# Patient Record
Sex: Male | Born: 1990 | Race: White | Hispanic: No | Marital: Single | Smoking: Never smoker
Health system: Southern US, Community
[De-identification: ages and names within clinical notes are randomized; demographics above are authoritative.]

## PROBLEM LIST (undated history)

## (undated) DIAGNOSIS — I10 Essential (primary) hypertension: Secondary | ICD-10-CM

## (undated) DIAGNOSIS — E119 Type 2 diabetes mellitus without complications: Secondary | ICD-10-CM

---

## 2015-11-03 ENCOUNTER — Inpatient Hospital Stay
Admit: 2015-11-03 | Discharge: 2015-11-03 | Disposition: A | Discharge status: Home / Self Care | Payer: Self-pay | Attending: Adult Health | Admitting: Adult Health

## 2015-11-03 ENCOUNTER — Inpatient Hospital Stay: Payer: Self-pay

## 2015-11-03 ENCOUNTER — Inpatient Hospital Stay
Admission: EM | Admit: 2015-11-03 | Discharge: 2015-11-06 | DRG: 377 | Disposition: E | Payer: Self-pay | Attending: Internal Medicine | Admitting: Internal Medicine

## 2015-11-03 ENCOUNTER — Encounter: Payer: Self-pay | Admitting: Emergency Medicine

## 2015-11-03 ENCOUNTER — Emergency Department: Payer: Self-pay

## 2015-11-03 DIAGNOSIS — G931 Anoxic brain damage, not elsewhere classified: Secondary | ICD-10-CM | POA: Diagnosis present

## 2015-11-03 DIAGNOSIS — R579 Shock, unspecified: Secondary | ICD-10-CM | POA: Diagnosis present

## 2015-11-03 DIAGNOSIS — D62 Acute posthemorrhagic anemia: Secondary | ICD-10-CM

## 2015-11-03 DIAGNOSIS — E875 Hyperkalemia: Secondary | ICD-10-CM | POA: Diagnosis present

## 2015-11-03 DIAGNOSIS — R109 Unspecified abdominal pain: Secondary | ICD-10-CM

## 2015-11-03 DIAGNOSIS — J189 Pneumonia, unspecified organism: Secondary | ICD-10-CM | POA: Diagnosis present

## 2015-11-03 DIAGNOSIS — E1022 Type 1 diabetes mellitus with diabetic chronic kidney disease: Secondary | ICD-10-CM | POA: Diagnosis present

## 2015-11-03 DIAGNOSIS — J9811 Atelectasis: Secondary | ICD-10-CM

## 2015-11-03 DIAGNOSIS — E872 Acidosis: Secondary | ICD-10-CM | POA: Diagnosis present

## 2015-11-03 DIAGNOSIS — I161 Hypertensive emergency: Secondary | ICD-10-CM | POA: Diagnosis present

## 2015-11-03 DIAGNOSIS — I469 Cardiac arrest, cause unspecified: Secondary | ICD-10-CM | POA: Diagnosis present

## 2015-11-03 DIAGNOSIS — G9382 Brain death: Secondary | ICD-10-CM | POA: Diagnosis present

## 2015-11-03 DIAGNOSIS — R140 Abdominal distension (gaseous): Secondary | ICD-10-CM

## 2015-11-03 DIAGNOSIS — J9690 Respiratory failure, unspecified, unspecified whether with hypoxia or hypercapnia: Secondary | ICD-10-CM

## 2015-11-03 DIAGNOSIS — N189 Chronic kidney disease, unspecified: Secondary | ICD-10-CM | POA: Diagnosis present

## 2015-11-03 DIAGNOSIS — Z794 Long term (current) use of insulin: Secondary | ICD-10-CM

## 2015-11-03 DIAGNOSIS — Z66 Do not resuscitate: Secondary | ICD-10-CM | POA: Diagnosis not present

## 2015-11-03 DIAGNOSIS — J9600 Acute respiratory failure, unspecified whether with hypoxia or hypercapnia: Secondary | ICD-10-CM

## 2015-11-03 DIAGNOSIS — R4189 Other symptoms and signs involving cognitive functions and awareness: Secondary | ICD-10-CM

## 2015-11-03 DIAGNOSIS — R0602 Shortness of breath: Secondary | ICD-10-CM

## 2015-11-03 DIAGNOSIS — A419 Sepsis, unspecified organism: Secondary | ICD-10-CM | POA: Diagnosis present

## 2015-11-03 DIAGNOSIS — E1021 Type 1 diabetes mellitus with diabetic nephropathy: Secondary | ICD-10-CM | POA: Diagnosis present

## 2015-11-03 DIAGNOSIS — K256 Chronic or unspecified gastric ulcer with both hemorrhage and perforation: Principal | ICD-10-CM | POA: Diagnosis present

## 2015-11-03 DIAGNOSIS — K922 Gastrointestinal hemorrhage, unspecified: Secondary | ICD-10-CM

## 2015-11-03 DIAGNOSIS — N170 Acute kidney failure with tubular necrosis: Secondary | ICD-10-CM | POA: Diagnosis present

## 2015-11-03 DIAGNOSIS — I468 Cardiac arrest due to other underlying condition: Secondary | ICD-10-CM | POA: Diagnosis present

## 2015-11-03 DIAGNOSIS — R402432 Glasgow coma scale score 3-8, at arrival to emergency department: Secondary | ICD-10-CM | POA: Diagnosis present

## 2015-11-03 DIAGNOSIS — Z452 Encounter for adjustment and management of vascular access device: Secondary | ICD-10-CM

## 2015-11-03 DIAGNOSIS — E861 Hypovolemia: Secondary | ICD-10-CM | POA: Diagnosis present

## 2015-11-03 DIAGNOSIS — J9601 Acute respiratory failure with hypoxia: Secondary | ICD-10-CM | POA: Diagnosis present

## 2015-11-03 HISTORY — DX: Type 2 diabetes mellitus without complications: E11.9

## 2015-11-03 HISTORY — DX: Essential (primary) hypertension: I10

## 2015-11-03 LAB — BLOOD GAS, ARTERIAL
ACID-BASE DEFICIT: 7.2 mmol/L — AB (ref 0.0–2.0)
Acid-base deficit: 21.4 mmol/L — ABNORMAL HIGH (ref 0.0–2.0)
BICARBONATE: 8.1 meq/L — AB (ref 21.0–28.0)
BICARBONATE: 17.5 meq/L — AB (ref 21.0–28.0)
FIO2: 1
FIO2: 0.5
MECHANICAL RATE: 18
MECHVT: 500 mL
Mechanical Rate: 18
O2 Saturation: 99.1 %
O2 Saturation: 99.3 %
PATIENT TEMPERATURE: 37
PATIENT TEMPERATURE: 37
PCO2 ART: 33 mmHg (ref 32.0–48.0)
PEEP/CPAP: 5 cmH2O
PEEP: 5 cmH2O
PH ART: 7.36 (ref 7.350–7.450)
PO2 ART: 189 mmHg — AB (ref 83.0–108.0)
VT: 500 mL
pCO2 arterial: 31 mmHg — ABNORMAL LOW (ref 32.0–48.0)
pH, Arterial: 7 — CL (ref 7.350–7.450)
pO2, Arterial: 153 mmHg — ABNORMAL HIGH (ref 83.0–108.0)

## 2015-11-03 LAB — BASIC METABOLIC PANEL
Anion gap: 30 — ABNORMAL HIGH (ref 5–15)
Anion gap: 22 — ABNORMAL HIGH (ref 5–15)
Anion gap: 25 — ABNORMAL HIGH (ref 5–15)
Anion gap: 21 — ABNORMAL HIGH (ref 5–15)
BUN: 176 mg/dL — AB (ref 6–20)
BUN: 173 mg/dL — AB (ref 6–20)
BUN: 161 mg/dL — AB (ref 6–20)
BUN: 157 mg/dL — AB (ref 6–20)
CHLORIDE: 95 mmol/L — AB (ref 101–111)
CHLORIDE: 99 mmol/L — AB (ref 101–111)
CHLORIDE: 97 mmol/L — AB (ref 101–111)
CHLORIDE: 98 mmol/L — AB (ref 101–111)
CO2: 13 mmol/L — AB (ref 22–32)
CO2: 15 mmol/L — AB (ref 22–32)
CO2: 15 mmol/L — AB (ref 22–32)
CO2: 16 mmol/L — AB (ref 22–32)
CREATININE: 12.35 mg/dL — AB (ref 0.61–1.24)
CREATININE: 12.62 mg/dL — AB (ref 0.61–1.24)
CREATININE: 11.5 mg/dL — AB (ref 0.61–1.24)
CREATININE: 11.18 mg/dL — AB (ref 0.61–1.24)
Calcium: 8 mg/dL — ABNORMAL LOW (ref 8.9–10.3)
Calcium: 7.4 mg/dL — ABNORMAL LOW (ref 8.9–10.3)
Calcium: 7.5 mg/dL — ABNORMAL LOW (ref 8.9–10.3)
Calcium: 7.4 mg/dL — ABNORMAL LOW (ref 8.9–10.3)
GFR calc Af Amer: 6 mL/min — ABNORMAL LOW (ref >60)
GFR calc Af Amer: 6 mL/min — ABNORMAL LOW (ref >60)
GFR calc Af Amer: 6 mL/min — ABNORMAL LOW (ref >60)
GFR calc Af Amer: 6 mL/min — ABNORMAL LOW (ref >60)
GFR calc non Af Amer: 5 mL/min — ABNORMAL LOW (ref >60)
GFR calc non Af Amer: 5 mL/min — ABNORMAL LOW (ref >60)
GFR calc non Af Amer: 5 mL/min — ABNORMAL LOW (ref >60)
GFR calc non Af Amer: 6 mL/min — ABNORMAL LOW (ref >60)
GLUCOSE: 336 mg/dL — AB (ref 65–99)
GLUCOSE: 358 mg/dL — AB (ref 65–99)
GLUCOSE: 367 mg/dL — AB (ref 65–99)
Glucose, Bld: 345 mg/dL — ABNORMAL HIGH (ref 65–99)
POTASSIUM: 5.8 mmol/L — AB (ref 3.5–5.1)
Potassium: 6.9 mmol/L (ref 3.5–5.1)
Potassium: 6.6 mmol/L (ref 3.5–5.1)
Potassium: 6.3 mmol/L (ref 3.5–5.1)
SODIUM: 138 mmol/L (ref 135–145)
SODIUM: 136 mmol/L (ref 135–145)
Sodium: 137 mmol/L (ref 135–145)
Sodium: 135 mmol/L (ref 135–145)

## 2015-11-03 LAB — TROPONIN I
TROPONIN I: 0.47 ng/mL — AB (ref <0.03)
TROPONIN I: 0.51 ng/mL — AB (ref <0.03)
Troponin I: 0.17 ng/mL (ref <0.03)
Troponin I: 0.56 ng/mL (ref <0.03)
Troponin I: 0.5 ng/mL (ref <0.03)

## 2015-11-03 LAB — RENAL FUNCTION PANEL
ANION GAP: 28 — AB (ref 5–15)
Albumin: 2.5 g/dL — ABNORMAL LOW (ref 3.5–5.0)
Albumin: 2.3 g/dL — ABNORMAL LOW (ref 3.5–5.0)
Anion gap: 23 — ABNORMAL HIGH (ref 5–15)
BUN: 171 mg/dL — AB (ref 6–20)
BUN: 171 mg/dL — ABNORMAL HIGH (ref 6–20)
CALCIUM: 7.8 mg/dL — AB (ref 8.9–10.3)
CHLORIDE: 99 mmol/L — AB (ref 101–111)
CHLORIDE: 96 mmol/L — AB (ref 101–111)
CO2: 14 mmol/L — AB (ref 22–32)
CO2: 13 mmol/L — AB (ref 22–32)
Calcium: 7.7 mg/dL — ABNORMAL LOW (ref 8.9–10.3)
Creatinine, Ser: 12.73 mg/dL — ABNORMAL HIGH (ref 0.61–1.24)
Creatinine, Ser: 12.29 mg/dL — ABNORMAL HIGH (ref 0.61–1.24)
GFR calc Af Amer: 5 mL/min — ABNORMAL LOW (ref >60)
GFR calc non Af Amer: 5 mL/min — ABNORMAL LOW (ref >60)
GFR calc non Af Amer: 5 mL/min — ABNORMAL LOW (ref >60)
GFR, EST AFRICAN AMERICAN: 6 mL/min — AB (ref >60)
GLUCOSE: 311 mg/dL — AB (ref 65–99)
Glucose, Bld: 345 mg/dL — ABNORMAL HIGH (ref 65–99)
POTASSIUM: 6.8 mmol/L — AB (ref 3.5–5.1)
POTASSIUM: 6.8 mmol/L — AB (ref 3.5–5.1)
Phosphorus: 12 mg/dL — ABNORMAL HIGH (ref 2.5–4.6)
Phosphorus: 11.1 mg/dL — ABNORMAL HIGH (ref 2.5–4.6)
SODIUM: 137 mmol/L (ref 135–145)
Sodium: 136 mmol/L (ref 135–145)

## 2015-11-03 LAB — CBC
HCT: 21.7 % — ABNORMAL LOW (ref 40.0–52.0)
HEMATOCRIT: 10.2 % — AB (ref 40.0–52.0)
HEMATOCRIT: 17.3 % — AB (ref 40.0–52.0)
HEMATOCRIT: 17.2 % — AB (ref 40.0–52.0)
HEMOGLOBIN: 3 g/dL — AB (ref 13.0–18.0)
HEMOGLOBIN: 7.3 g/dL — AB (ref 13.0–18.0)
HEMOGLOBIN: 5.9 g/dL — AB (ref 13.0–18.0)
Hemoglobin: 6 g/dL — ABNORMAL LOW (ref 13.0–18.0)
MCH: 29.6 pg (ref 26.0–34.0)
MCH: 29.5 pg (ref 26.0–34.0)
MCH: 29.4 pg (ref 26.0–34.0)
MCH: 28.9 pg (ref 26.0–34.0)
MCHC: 29.6 g/dL — ABNORMAL LOW (ref 32.0–36.0)
MCHC: 33.4 g/dL (ref 32.0–36.0)
MCHC: 34.7 g/dL (ref 32.0–36.0)
MCHC: 34.3 g/dL (ref 32.0–36.0)
MCV: 100.2 fL — AB (ref 80.0–100.0)
MCV: 88.2 fL (ref 80.0–100.0)
MCV: 84.6 fL (ref 80.0–100.0)
MCV: 84.4 fL (ref 80.0–100.0)
PLATELETS: 173 10*3/uL (ref 150–440)
PLATELETS: 145 10*3/uL — AB (ref 150–440)
Platelets: 193 10*3/uL (ref 150–440)
Platelets: 104 10*3/uL — ABNORMAL LOW (ref 150–440)
RBC: 1.01 MIL/uL — AB (ref 4.40–5.90)
RBC: 2.46 MIL/uL — ABNORMAL LOW (ref 4.40–5.90)
RBC: 2.05 MIL/uL — ABNORMAL LOW (ref 4.40–5.90)
RBC: 2.04 MIL/uL — ABNORMAL LOW (ref 4.40–5.90)
RDW: 16.5 % — ABNORMAL HIGH (ref 11.5–14.5)
RDW: 16.2 % — AB (ref 11.5–14.5)
RDW: 16.5 % — AB (ref 11.5–14.5)
RDW: 16.9 % — AB (ref 11.5–14.5)
WBC: 13.3 10*3/uL — ABNORMAL HIGH (ref 3.8–10.6)
WBC: 18.3 10*3/uL — ABNORMAL HIGH (ref 3.8–10.6)
WBC: 12.5 10*3/uL — ABNORMAL HIGH (ref 3.8–10.6)
WBC: 12.5 10*3/uL — ABNORMAL HIGH (ref 3.8–10.6)

## 2015-11-03 LAB — ABO/RH: ABO/RH(D): O POS

## 2015-11-03 LAB — DIFFERENTIAL
Basophils Absolute: 0.1 10*3/uL (ref 0–0.1)
Basophils Relative: 1 %
Eosinophils Absolute: 0.1 10*3/uL (ref 0–0.7)
Eosinophils Relative: 0 %
LYMPHS ABS: 2.7 10*3/uL (ref 1.0–3.6)
MONO ABS: 0.3 10*3/uL (ref 0.2–1.0)
NEUTROS ABS: 10.1 10*3/uL — AB (ref 1.4–6.5)
Neutrophils Relative %: 75 %

## 2015-11-03 LAB — COMPREHENSIVE METABOLIC PANEL
ALBUMIN: 2.4 g/dL — AB (ref 3.5–5.0)
ALK PHOS: 92 U/L (ref 38–126)
ALT: 26 U/L (ref 17–63)
ALT: 26 U/L (ref 17–63)
ANION GAP: 31 — AB (ref 5–15)
AST: 35 U/L (ref 15–41)
AST: 51 U/L — ABNORMAL HIGH (ref 15–41)
Albumin: 2.4 g/dL — ABNORMAL LOW (ref 3.5–5.0)
Alkaline Phosphatase: 85 U/L (ref 38–126)
Anion gap: 25 — ABNORMAL HIGH (ref 5–15)
BILIRUBIN TOTAL: 0.7 mg/dL (ref 0.3–1.2)
BILIRUBIN TOTAL: 1.1 mg/dL (ref 0.3–1.2)
BUN: 188 mg/dL — ABNORMAL HIGH (ref 6–20)
BUN: 171 mg/dL — ABNORMAL HIGH (ref 6–20)
CALCIUM: 8.4 mg/dL — AB (ref 8.9–10.3)
CO2: 10 mmol/L — AB (ref 22–32)
CO2: 13 mmol/L — ABNORMAL LOW (ref 22–32)
CREATININE: 12.93 mg/dL — AB (ref 0.61–1.24)
Calcium: 8.1 mg/dL — ABNORMAL LOW (ref 8.9–10.3)
Chloride: 99 mmol/L — ABNORMAL LOW (ref 101–111)
Chloride: 95 mmol/L — ABNORMAL LOW (ref 101–111)
Creatinine, Ser: 12.43 mg/dL — ABNORMAL HIGH (ref 0.61–1.24)
GFR calc Af Amer: 6 mL/min — ABNORMAL LOW (ref >60)
GFR calc non Af Amer: 5 mL/min — ABNORMAL LOW (ref >60)
GFR, EST AFRICAN AMERICAN: 5 mL/min — AB (ref >60)
GFR, EST NON AFRICAN AMERICAN: 5 mL/min — AB (ref >60)
GLUCOSE: 240 mg/dL — AB (ref 65–99)
Glucose, Bld: 333 mg/dL — ABNORMAL HIGH (ref 65–99)
POTASSIUM: 6.7 mmol/L — AB (ref 3.5–5.1)
Potassium: 6.5 mmol/L (ref 3.5–5.1)
SODIUM: 134 mmol/L — AB (ref 135–145)
Sodium: 139 mmol/L (ref 135–145)
TOTAL PROTEIN: 5 g/dL — AB (ref 6.5–8.1)
Total Protein: 5.1 g/dL — ABNORMAL LOW (ref 6.5–8.1)

## 2015-11-03 LAB — GLUCOSE, CAPILLARY
GLUCOSE-CAPILLARY: 279 mg/dL — AB (ref 65–99)
GLUCOSE-CAPILLARY: 331 mg/dL — AB (ref 65–99)
GLUCOSE-CAPILLARY: 387 mg/dL — AB (ref 65–99)
Glucose-Capillary: 272 mg/dL — ABNORMAL HIGH (ref 65–99)
Glucose-Capillary: 360 mg/dL — ABNORMAL HIGH (ref 65–99)

## 2015-11-03 LAB — ALBUMIN: Albumin: 2.2 g/dL — ABNORMAL LOW (ref 3.5–5.0)

## 2015-11-03 LAB — MAGNESIUM
MAGNESIUM: 3.6 mg/dL — AB (ref 1.7–2.4)
MAGNESIUM: 3.3 mg/dL — AB (ref 1.7–2.4)
MAGNESIUM: 3.2 mg/dL — AB (ref 1.7–2.4)
Magnesium: 3.5 mg/dL — ABNORMAL HIGH (ref 1.7–2.4)

## 2015-11-03 LAB — LIPID PANEL
Cholesterol: 56 mg/dL (ref 0–200)
HDL: 18 mg/dL — ABNORMAL LOW
LDL Cholesterol: 17 mg/dL (ref 0–99)
Total CHOL/HDL Ratio: 3.1 ratio
Triglycerides: 103 mg/dL
VLDL: 21 mg/dL (ref 0–40)

## 2015-11-03 LAB — TRIGLYCERIDES: Triglycerides: 145 mg/dL (ref <150)

## 2015-11-03 LAB — PHOSPHORUS
PHOSPHORUS: 11.8 mg/dL — AB (ref 2.5–4.6)
PHOSPHORUS: 10.3 mg/dL — AB (ref 2.5–4.6)

## 2015-11-03 LAB — PROTIME-INR
INR: 1.46
INR: 1.54
INR: 1.55
Prothrombin Time: 17.9 seconds — ABNORMAL HIGH (ref 11.4–15.2)
Prothrombin Time: 18.6 seconds — ABNORMAL HIGH (ref 11.4–15.2)
Prothrombin Time: 18.7 seconds — ABNORMAL HIGH (ref 11.4–15.2)

## 2015-11-03 LAB — LIPASE, BLOOD: LIPASE: 53 U/L — AB (ref 11–51)

## 2015-11-03 LAB — MRSA PCR SCREENING: MRSA by PCR: NEGATIVE

## 2015-11-03 LAB — PROCALCITONIN: Procalcitonin: 1.37 ng/mL

## 2015-11-03 LAB — APTT
APTT: 33 s (ref 24–36)
APTT: 36 s (ref 24–36)
APTT: 39 s — AB (ref 24–36)

## 2015-11-03 LAB — CK: CK TOTAL: 874 U/L — AB (ref 49–397)

## 2015-11-03 LAB — PREPARE RBC (CROSSMATCH)

## 2015-11-03 MED ORDER — SODIUM CHLORIDE 0.9 % IV SOLN: 2000.0000 mL | Freq: Once | INTRAVENOUS | Status: DC

## 2015-11-03 MED ORDER — SODIUM CHLORIDE 0.9 % IV SOLN: 1.0000 g | Freq: Once | INTRAVENOUS | Status: DC

## 2015-11-03 MED ORDER — FENTANYL 2500MCG IN NS 250ML (10MCG/ML) PREMIX INFUSION
100.0000 ug/h | INTRAVENOUS | Status: DC
  Administered 2015-11-03: 100 ug/h via INTRAVENOUS
  Administered 2015-11-04: 75 ug/h via INTRAVENOUS
  Filled 2015-11-03 (×2): qty 250

## 2015-11-03 MED ORDER — ACETAMINOPHEN 650 MG RE SUPP: 650.0000 mg | Freq: Four times a day (QID) | RECTAL | Status: DC | PRN

## 2015-11-03 MED ORDER — VANCOMYCIN HCL IN DEXTROSE 1-5 GM/200ML-% IV SOLN
1000.0000 mg | INTRAVENOUS | Status: DC | PRN
  Filled 2015-11-03: qty 200

## 2015-11-03 MED ORDER — CALCIUM GLUCONATE 10 % IV SOLN
INTRAVENOUS | Status: AC
  Filled 2015-11-03: qty 10

## 2015-11-03 MED ORDER — OCTREOTIDE ACETATE 500 MCG/ML IJ SOLN
50.0000 ug/h | INTRAMUSCULAR | Status: DC
  Administered 2015-11-03 – 2015-11-05 (×3): 50 ug/h via INTRAVENOUS
  Filled 2015-11-03 (×11): qty 1

## 2015-11-03 MED ORDER — SODIUM CHLORIDE 0.9 % IV SOLN
Freq: Once | INTRAVENOUS | Status: AC
  Administered 2015-11-03: 05:00:00 via INTRAVENOUS

## 2015-11-03 MED ORDER — FENTANYL BOLUS VIA INFUSION
50.0000 ug | INTRAVENOUS | Status: DC | PRN
  Filled 2015-11-03: qty 50

## 2015-11-03 MED ORDER — INSULIN ASPART 100 UNIT/ML ~~LOC~~ SOLN
0.0000 [IU] | SUBCUTANEOUS | Status: DC
  Administered 2015-11-03: 7 [IU] via SUBCUTANEOUS
  Administered 2015-11-03: 9 [IU] via SUBCUTANEOUS
  Administered 2015-11-04: 5 [IU] via SUBCUTANEOUS
  Administered 2015-11-04: 9 [IU] via SUBCUTANEOUS
  Administered 2015-11-04: 7 [IU] via SUBCUTANEOUS
  Filled 2015-11-03: qty 9
  Filled 2015-11-03: qty 7
  Filled 2015-11-03: qty 5
  Filled 2015-11-03: qty 7
  Filled 2015-11-03: qty 9

## 2015-11-03 MED ORDER — PROPOFOL 1000 MG/100ML IV EMUL
INTRAVENOUS | Status: AC
  Administered 2015-11-03: 5 ug/kg/min via INTRAVENOUS
  Filled 2015-11-03: qty 100

## 2015-11-03 MED ORDER — HYDRALAZINE HCL 20 MG/ML IJ SOLN
INTRAMUSCULAR | Status: AC
  Filled 2015-11-03: qty 1

## 2015-11-03 MED ORDER — DOPAMINE-DEXTROSE 3.2-5 MG/ML-% IV SOLN: 10.0000 ug/kg/min | Freq: Once | INTRAVENOUS | Status: DC

## 2015-11-03 MED ORDER — VANCOMYCIN HCL IN DEXTROSE 750-5 MG/150ML-% IV SOLN
750.0000 mg | INTRAVENOUS | Status: DC | PRN
  Filled 2015-11-03: qty 150

## 2015-11-03 MED ORDER — FENTANYL CITRATE (PF) 100 MCG/2ML IJ SOLN
100.0000 ug | Freq: Once | INTRAMUSCULAR | Status: AC
  Administered 2015-11-03: 100 ug via INTRAVENOUS

## 2015-11-03 MED ORDER — PIPERACILLIN-TAZOBACTAM 3.375 G IVPB
3.3750 g | Freq: Two times a day (BID) | INTRAVENOUS | Status: DC
  Administered 2015-11-03: 3.375 g via INTRAVENOUS
  Filled 2015-11-03 (×2): qty 50

## 2015-11-03 MED ORDER — PIPERACILLIN-TAZOBACTAM 3.375 G IVPB
3.3750 g | Freq: Three times a day (TID) | INTRAVENOUS | Status: DC
  Administered 2015-11-03 – 2015-11-05 (×5): 3.375 g via INTRAVENOUS
  Filled 2015-11-03 (×7): qty 50

## 2015-11-03 MED ORDER — CALCIUM GLUCONATE 10 % IV SOLN
1.0000 g | Freq: Once | INTRAVENOUS | Status: AC
  Administered 2015-11-03: 1 g via INTRAVENOUS

## 2015-11-03 MED ORDER — ATROPINE SULFATE 1 MG/ML IJ SOLN
INTRAMUSCULAR | Status: AC | PRN
  Administered 2015-11-03 (×2): 1 mg via INTRAVENOUS

## 2015-11-03 MED ORDER — PANTOPRAZOLE SODIUM 40 MG IV SOLR: 40.0000 mg | Freq: Two times a day (BID) | INTRAVENOUS | Status: DC

## 2015-11-03 MED ORDER — DEXTROSE 5 % IV SOLN
INTRAVENOUS | Status: DC
  Administered 2015-11-03 – 2015-11-05 (×5): via INTRAVENOUS
  Filled 2015-11-03 (×12): qty 150

## 2015-11-03 MED ORDER — SODIUM BICARBONATE 8.4 % IV SOLN
INTRAVENOUS | Status: AC
  Filled 2015-11-03: qty 100

## 2015-11-03 MED ORDER — SODIUM CHLORIDE 0.9 % IV SOLN
Freq: Once | INTRAVENOUS | Status: AC
  Administered 2015-11-03: 07:00:00 via INTRAVENOUS

## 2015-11-03 MED ORDER — INSULIN ASPART 100 UNIT/ML ~~LOC~~ SOLN
SUBCUTANEOUS | Status: AC
  Filled 2015-11-03: qty 10

## 2015-11-03 MED ORDER — CISATRACURIUM BOLUS VIA INFUSION
0.1000 mg/kg | Freq: Once | INTRAVENOUS | Status: DC
  Filled 2015-11-03: qty 7

## 2015-11-03 MED ORDER — SODIUM CHLORIDE 0.9 % IV SOLN
INTRAVENOUS | Status: DC
  Administered 2015-11-03: 07:00:00 via INTRAVENOUS

## 2015-11-03 MED ORDER — ROCURONIUM BROMIDE 50 MG/5ML IV SOLN
1.0000 mg/kg | Freq: Once | INTRAVENOUS | Status: AC
  Administered 2015-11-03: 70 mg via INTRAVENOUS

## 2015-11-03 MED ORDER — ASPIRIN 300 MG RE SUPP
300.0000 mg | RECTAL | Status: AC
  Filled 2015-11-03: qty 1

## 2015-11-03 MED ORDER — VANCOMYCIN HCL IN DEXTROSE 1-5 GM/200ML-% IV SOLN
1000.0000 mg | Freq: Once | INTRAVENOUS | Status: AC
  Administered 2015-11-03: 1000 mg via INTRAVENOUS
  Filled 2015-11-03: qty 200

## 2015-11-03 MED ORDER — HEPARIN SODIUM (PORCINE) 1000 UNIT/ML DIALYSIS
1000.0000 [IU] | INTRAMUSCULAR | Status: DC | PRN
  Administered 2015-11-05: 1000 [IU] via INTRAVENOUS_CENTRAL
  Filled 2015-11-03 (×2): qty 10

## 2015-11-03 MED ORDER — ARTIFICIAL TEARS OP OINT
1.0000 "application " | TOPICAL_OINTMENT | Freq: Three times a day (TID) | OPHTHALMIC | Status: DC
  Administered 2015-11-03 – 2015-11-05 (×6): 1 via OPHTHALMIC
  Filled 2015-11-03: qty 3.5

## 2015-11-03 MED ORDER — MIDAZOLAM HCL 2 MG/2ML IJ SOLN: 2.0000 mg | INTRAMUSCULAR | Status: DC | PRN

## 2015-11-03 MED ORDER — SODIUM CHLORIDE 0.9 % IV SOLN
1.0000 ug/kg/min | INTRAVENOUS | Status: DC
  Filled 2015-11-03: qty 20

## 2015-11-03 MED ORDER — SODIUM BICARBONATE 8.4 % IV SOLN
INTRAVENOUS | Status: AC | PRN
  Administered 2015-11-03: 50 meq via INTRAVENOUS

## 2015-11-03 MED ORDER — CHLORHEXIDINE GLUCONATE 0.12% ORAL RINSE (MEDLINE KIT)
15.0000 mL | Freq: Two times a day (BID) | OROMUCOSAL | Status: DC
  Administered 2015-11-03 – 2015-11-05 (×4): 15 mL via OROMUCOSAL
  Filled 2015-11-03 (×5): qty 15

## 2015-11-03 MED ORDER — SODIUM BICARBONATE 8.4 % IV SOLN
50.0000 meq | Freq: Once | INTRAVENOUS | Status: AC
  Administered 2015-11-03: 50 meq via INTRAVENOUS

## 2015-11-03 MED ORDER — PROPOFOL 1000 MG/100ML IV EMUL
5.0000 ug/kg/min | INTRAVENOUS | Status: DC
  Administered 2015-11-03 (×2): 5 ug/kg/min via INTRAVENOUS

## 2015-11-03 MED ORDER — SODIUM CHLORIDE 0.9 % IV SOLN: 250.0000 mL | INTRAVENOUS | Status: DC | PRN

## 2015-11-03 MED ORDER — SODIUM CHLORIDE 0.9 % IV SOLN
80.0000 mg | Freq: Once | INTRAVENOUS | Status: AC
  Administered 2015-11-03: 80 mg via INTRAVENOUS
  Filled 2015-11-03: qty 80

## 2015-11-03 MED ORDER — ROCURONIUM BROMIDE 50 MG/5ML IV SOLN
INTRAVENOUS | Status: AC
  Filled 2015-11-03: qty 1

## 2015-11-03 MED ORDER — OCTREOTIDE LOAD VIA INFUSION
50.0000 ug | Freq: Once | INTRAVENOUS | Status: AC
  Administered 2015-11-03: 50 ug via INTRAVENOUS
  Filled 2015-11-03: qty 25

## 2015-11-03 MED ORDER — ONDANSETRON HCL 4 MG/2ML IJ SOLN: 4.0000 mg | Freq: Four times a day (QID) | INTRAMUSCULAR | Status: DC | PRN

## 2015-11-03 MED ORDER — NOREPINEPHRINE 4 MG/250ML-% IV SOLN: 0.0000 ug/min | INTRAVENOUS | Status: DC

## 2015-11-03 MED ORDER — HYDRALAZINE HCL 20 MG/ML IJ SOLN
10.0000 mg | INTRAMUSCULAR | Status: DC | PRN
  Administered 2015-11-03: 10 mg via INTRAVENOUS
  Administered 2015-11-04 (×2): 20 mg via INTRAVENOUS
  Filled 2015-11-03 (×3): qty 1

## 2015-11-03 MED ORDER — INSULIN ASPART 100 UNIT/ML ~~LOC~~ SOLN: 2.0000 [IU] | SUBCUTANEOUS | Status: DC

## 2015-11-03 MED ORDER — PUREFLOW DIALYSIS SOLUTION
INTRAVENOUS | Status: DC
  Administered 2015-11-03 – 2015-11-05 (×6): via INTRAVENOUS_CENTRAL

## 2015-11-03 MED ORDER — FAMOTIDINE IN NACL 20-0.9 MG/50ML-% IV SOLN
20.0000 mg | Freq: Two times a day (BID) | INTRAVENOUS | Status: DC
  Administered 2015-11-03: 20 mg via INTRAVENOUS
  Filled 2015-11-03 (×2): qty 50

## 2015-11-03 MED ORDER — PANTOPRAZOLE SODIUM 40 MG IV SOLR
8.0000 mg/h | INTRAVENOUS | Status: DC
  Administered 2015-11-03 – 2015-11-05 (×5): 8 mg/h via INTRAVENOUS
  Filled 2015-11-03 (×5): qty 80

## 2015-11-03 MED ORDER — PROPOFOL 1000 MG/100ML IV EMUL
25.0000 ug/kg/min | INTRAVENOUS | Status: DC
  Administered 2015-11-03: 30 ug/kg/min via INTRAVENOUS
  Administered 2015-11-04 (×2): 40 ug/kg/min via INTRAVENOUS
  Administered 2015-11-04 – 2015-11-05 (×2): 30 ug/kg/min via INTRAVENOUS
  Filled 2015-11-03: qty 100

## 2015-11-03 MED ORDER — SODIUM BICARBONATE 8.4 % IV SOLN: 100.0000 meq | Freq: Once | INTRAVENOUS | Status: DC

## 2015-11-03 MED ORDER — NOREPINEPHRINE BITARTRATE 1 MG/ML IV SOLN: 0.0000 ug/min | INTRAVENOUS | Status: DC

## 2015-11-03 MED ORDER — PROPOFOL 1000 MG/100ML IV EMUL
0.0000 ug/kg/min | INTRAVENOUS | Status: DC
  Administered 2015-11-03: 8 ug/kg/min via INTRAVENOUS
  Filled 2015-11-03 (×8): qty 100

## 2015-11-03 MED ORDER — EPINEPHRINE HCL 0.1 MG/ML IJ SOSY
PREFILLED_SYRINGE | INTRAMUSCULAR | Status: AC | PRN
  Administered 2015-11-03 (×2): 1 via INTRAVENOUS

## 2015-11-03 MED ORDER — ANTISEPTIC ORAL RINSE SOLUTION (CORINZ)
7.0000 mL | OROMUCOSAL | Status: DC
  Administered 2015-11-03 – 2015-11-05 (×18): 7 mL via OROMUCOSAL
  Filled 2015-11-03 (×21): qty 7

## 2015-11-03 MED ORDER — DOPAMINE-DEXTROSE 3.2-5 MG/ML-% IV SOLN
INTRAVENOUS | Status: AC | PRN
  Administered 2015-11-03: 10 ug/kg/min via INTRAVENOUS

## 2015-11-03 MED ORDER — SENNOSIDES 8.8 MG/5ML PO SYRP: 5.0000 mL | ORAL_SOLUTION | Freq: Two times a day (BID) | ORAL | Status: DC | PRN

## 2015-11-03 MED ORDER — BISACODYL 10 MG RE SUPP: 10.0000 mg | Freq: Every day | RECTAL | Status: DC | PRN

## 2015-11-03 MED ORDER — SODIUM CHLORIDE 0.9 % IV SOLN
Freq: Once | INTRAVENOUS | Status: AC
  Administered 2015-11-03: 22:00:00 via INTRAVENOUS

## 2015-11-03 MED ORDER — CISATRACURIUM BOLUS VIA INFUSION
0.0500 mg/kg | INTRAVENOUS | Status: DC | PRN
  Filled 2015-11-03: qty 4

## 2015-11-03 MED ORDER — INSULIN ASPART 100 UNIT/ML ~~LOC~~ SOLN: 0.0000 [IU] | SUBCUTANEOUS | Status: DC

## 2015-11-03 MED ORDER — INSULIN ASPART 100 UNIT/ML ~~LOC~~ SOLN
10.0000 [IU] | Freq: Once | SUBCUTANEOUS | Status: AC
  Administered 2015-11-03: 10 [IU] via INTRAVENOUS

## 2015-11-03 MED ORDER — FAMOTIDINE IN NACL 20-0.9 MG/50ML-% IV SOLN
20.0000 mg | INTRAVENOUS | Status: DC
  Filled 2015-11-03: qty 50

## 2015-11-03 MED ORDER — NICARDIPINE HCL IN NACL 20-0.86 MG/200ML-% IV SOLN
3.0000 mg/h | INTRAVENOUS | Status: DC
  Administered 2015-11-03: 3 mg/h via INTRAVENOUS
  Filled 2015-11-03: qty 200

## 2015-11-03 NOTE — Progress Notes (Signed)
Pt returned from CT at this time.  Dr. Ardyth Man at bedside to reevaluate patient.

## 2015-11-03 NOTE — H&P (Signed)
PULMONARY / CRITICAL CARE MEDICINE   Name: Jesse Gilbert MRN: [id number] DOB: [birth year]    ADMISSION DATE:  11/04/2015     DISCUSSION: 25 y/o Amish male with a  H/o T1DM, and hypertension presenting with acute blood loss anemia secondary to prolonged GI bleed, asystole cardiac arrest, acute respiratory failure and acute renal failure. High risk for anoxic brain injury given prolonged downtime, initial Hb=3.  ASSESSMENT / PLAN:  PULMONARY A: Acute hypoxic respiratory failure secondary to cardiac arrest Right upper lobe infiltrate P:   -Full vent support -VAP protocol -Daily CXR -Daily ABG -Sputum culture -ABX as above -Weaning trials when appropriate  CARDIOVASCULAR A:  Asystole cardiac arrest Hypertensive emergency P:   -Hemodynamic monitoring per ICU -Cardene infusion-titrate to keep MAP 65-100 -2-D echo -Cardiology consult -EKG  STUDIES:  2-D echo 07/29  RENAL A:   Acute renal failure-Possible acute ATN secondary to volume depletion superimposed on diabetic nephropathy Hyperkalemia P:   -Nephrology consulted -HD per nephrology -Monitor and correct electrolyte imbalances -IV fluids -Foley and foley care -Monitor Is/Os -Renally dose medications  GASTROINTESTINAL A:   Acute GI bleed-exact etiology unclear. Patient's father denies any NSAID use but patient used multiple home remedies--question whether home remedies contained aspirin. Also this may be due to uremia; initial H/H 3.0/10.2 Abdominal distension P:   -Transfuse blood products as needed  -cbc q6, stat coags.  -Protonix and octreotide infusions -GI consulted-Spoke with Dr. Servando Snare who recommended PPI and octreotide -EDG and colonoscopy once stable  HEMATOLOGIC A:   Acute blood loss anemia P:  -Transfuse blood products as needed; patient already received 4 units of PRBCs  -Monitor CBC  INFECTIOUS A:   Sepsis of unknown source-GI versus respiratory Right upper lobe infiltrate suggestive  of CAP P:   -Empiric antibiotics -F/U Cultures -Trend procalcitonin  ENDOCRINE A:   T1DM P:   -Blood glucose monitoring with SSI coverage Q4H -Insulin infusion if needed  CULTURES: O7/29 Blood x 2 Urine Sputum  ANTIBIOTICS: Vancomycin 07/29> Zosyn 07/29>  NEUROLOGIC A:   Acute encephalopathy-uremic versus hypoxic versus hypovolemia P:   RASS goal: 0 to -1 -STAT CT head  -Will consider neurology consult if patient remains unresponsive -Monitor neuro vital signs -Low dose propofol for vent dyssynchrony  SIGNIFICANT EVENTS: 07/29: Cardiac arrest at home  LINES/TUBES: Left femoral A-Line 07/29 Right Vasc cath 07/29> Left subclavian 07/29  ------------------------------------------------------  REFERRING MD:  Dr. Manson Passey  CHIEF COMPLAINT: Cardiac arrest  HISTORY OF PRESENT ILLNESS:   This is a 25 yo Amish male with a PMH of type1 DM,and hypertension who presents following a cardiac arrest at home. History is obtained from patient's father, EMS and ED records as patient is currently intubated and sedated. Per patient's father, it all started in the winter when patient developed a cough that never resolved. A few weeks ago, he started passing out dark tarry stools. He gradually got sicker and weaker until this morning when he noticed that he was unresponsive hence EMS was called. He was instructed to start CPR. Upon EMS' arrival, patient was in asystole and apneic. CPR was continued and he was given 6 doses of epi with sustained ROSC.  Upon arrival in the ED, patient coded again and received 2 doses of Epi and 2 doses of atropine. He was intubated in the ED. Total downtime is estimated at >1 hr. He continues to have dark tarry stools. Patient's father states that he took multiple home remedies and vitamins when he got sick. He  is not on any antihypertensives but takes insulin for his diabetes. Denies h/o renal insufficiency.  Initial labs showed a hemoglobin of 3.0,  creatinine of 13 and pH of 6.8.   PAST MEDICAL HISTORY :  He  has a past medical history of Diabetes mellitus without complication (HCC) and Hypertension.  PAST SURGICAL HISTORY: He  has no past surgical history on file.  No Known Allergies  No current facility-administered medications on file prior to encounter.    No current outpatient prescriptions on file prior to encounter.    FAMILY HISTORY:  His has no family status information on file.    SOCIAL HISTORY: He  reports that he has never smoked. He has never used smokeless tobacco. He reports that he does not drink alcohol or use drugs.  REVIEW OF SYSTEMS:   Unable to obtain; patient is intubated and sedated  SUBJECTIVE:    VITAL SIGNS: BP (!) 144/109   Pulse (!) 104   Temp (!) 90.6 F (32.6 C) (Rectal)   Resp 18   Ht 5\' 6"  (1.676 m)   Wt 154 lb 5.2 oz (70 kg)   SpO2 100%   BMI 24.91 kg/m   HEMODYNAMICS:    VENTILATOR SETTINGS: Vent Mode: AC FiO2 (%):  [100 %] 100 % Set Rate:  [18 bmp] 18 bmp Vt Set:  [500 mL] 500 mL PEEP:  [5 cmH20] 5 cmH20  INTAKE / OUTPUT: No intake/output data recorded.  PHYSICAL EXAMINATION: General: acutely ill-looking, edematous Neuro: Unresponsive to noxious stimulus, pupils dilated, fixed and unreactive.  HEENT: [place]/AT, oral mucosa with moderate secretions, poor dentition, ETT, trachea midline, no JVD Cardiovascular: RRR, S1/S2, no MRG, anasarca Lungs: CTAB, diminished in the bases  Abdomen: Distended, hypoactive bowel sounds, tympanic to percussion Musculoskeletal:  No visible deformities, no joint swelling Extremities: +2 edema, +2 pulses bilaterally.  Skin: pale, no rash  LABS:  BMET  Recent Labs Lab 11/08/15 0350  NA 134*  K 6.5*  CL 99*  CO2 10*  BUN 188*  CREATININE 12.93*  GLUCOSE 333*    Electrolytes  Recent Labs Lab 11/08/15 0350  CALCIUM 8.4*    CBC  Recent Labs Lab 11/08/15 0350  WBC 13.3*  HGB 3.0*  HCT 10.2*  PLT 193     Coag's No results for input(s): APTT, INR in the last 168 hours.  Sepsis Markers No results for input(s): LATICACIDVEN, PROCALCITON, O2SATVEN in the last 168 hours.  ABG  Recent Labs Lab 08-Nov-2015 0410 08-Nov-2015 0515  PHART 6.86* 7.00*  PCO2ART 49* 33  PO2ART 153* 189*    Liver Enzymes  Recent Labs Lab November 08, 2015 0350  AST 35  ALT 26  ALKPHOS 92  BILITOT 0.7  ALBUMIN 2.4*    Cardiac Enzymes  Recent Labs Lab 11-08-2015 0350  TROPONINI 0.17*    Glucose  Recent Labs Lab 11-08-15 0337  GLUCAP 272*    Imaging Dg Chest Port 1 View  Result Date: 2015/11/08 CLINICAL DATA:  25 year old male with cardiac arrest. EXAM: PORTABLE CHEST 1 VIEW COMPARISON:  None. FINDINGS: Endotracheal tube is noted with tip in the right mainstem bronchus. Recommend retraction by approximately 5 cm. Enteric tube courses to the left abdomen with tip beyond the inferior margin of the image. Patchy area of airspace density in the right upper lobe may represent atelectasis versus pneumonia. Small loculated right upper lobe pleural effusion may be present. There is no pneumothorax. Top-normal cardiac silhouette. No acute osseous pathology. IMPRESSION: Endotracheal tube in the right mainstem bronchus.  Recommend retraction by approximately 5 cm. Right upper lobe atelectasis versus infiltrate as well as small right pleural effusion, possibly loculated. These results were called by telephone at the time of interpretation on 10/28/2015 at 4:42 am to Dr. Bayard Males , who verbally acknowledged these results. Electronically Signed   By: Elgie Collard M.D.   On: 11/02/2015 04:42      Disposition and family update: Patient's father updated on current treatment plan at bedside.   Patient's prognosis is guarded given prolonged downtime. Further changes in treatment plan pending patient's clinical course and diagnositics  Best Practice: Code Status:  Full. Diet: NPO GI prophylaxis:  PPI. VTE  prophylaxis:  SCD's /no pharmacologic VTE prophylaxis due to acute GIB  Magdalene S. [facility] ANP-BC Pulmonary and Critical Care Medicine [facility] Pager [phone] or [phone] 10/28/2015, 5:38 AM   Pt seen and examined with NP; agree with assessment and plan. Currently pt appears unresponsive with no response to painful stimuli. [place]id[place] hypertensive, will start cardene, ordered stat CT head. Prognosis appears poor.   Wells Guiles, M.D.  10/21/2015   Critical Care Attestation.  I have personally obtained a history, examined the patient, evaluated laboratory and imaging results, formulated the assessment and plan and placed orders. The Patient requires high complexity decision making for assessment and support, frequent evaluation and titration of therapies, application of advanced monitoring technologies and extensive interpretation of multiple databases. The patient has critical illness that could lead imminently to failure of 1 or more organ systems and requires the highest level of physician preparedness to intervene.  Critical Care Time devoted to patient care services described in this note is 60 minutes and is exclusive of time spent in procedures.

## 2015-11-03 NOTE — ED Notes (Signed)
Patient on his 4th unit of PRBCs (O negative); ABG shows that patient is acidotic with a pH of 6.86. MD consulted and asked for additional Bicarb. VORB for 2 amps ( ) of Bicap at this time. Order to be entered and carried by this RN.

## 2015-11-03 NOTE — Progress Notes (Signed)
Pharmacy note  Octreotide drip ordered per consult at 50 mcg load with 50 mcg/hr continuous infusion for GI bleed.

## 2015-11-03 NOTE — Progress Notes (Signed)
MEDICATION RELATED CONSULT NOTE - INITIAL   Pharmacy Consult for Medication review for CRRT Indication: Dosage adjustment for CRRT  No Known Allergies  Patient Measurements: Height: 5\' 6"  (167.6 cm) Weight: 154 lb 5.2 oz (70 kg) IBW/kg (Calculated) : 63.8   Vital Signs: Temp: 92.8 F (33.8 C) (07/29 1100) Temp Source: Rectal (07/29 0457) BP: 152/72 (07/29 1100) Pulse Rate: 79 (07/29 1100) Intake/Output from previous day: 07/28 0701 - 07/29 0700 In: 1480 [Blood:1480] Out: -  Intake/Output from this shift: No intake/output data recorded.  Labs:  Recent Labs  11/04/2015 0350 10/14/2015 0634 11/04/2015 0840  WBC 13.3* 18.3*  --   HGB 3.0* 7.3*  --   HCT 10.2* 21.7*  --   PLT 193 173  --   APTT  --  33  --   CREATININE 12.93*  --  12.43*  MG  --   --  3.6*  PHOS  --   --  11.8*  ALBUMIN 2.4*  --  2.4*  PROT 5.1*  --  5.0*  AST 35  --  51*  ALT 26  --  26  ALKPHOS 92  --  85  BILITOT 0.7  --  1.1   Estimated Creatinine Clearance: 8.1 mL/min (by C-G formula based on SCr of 12.43 mg/dL).   Assessment: 25 yo male starting CRRT Per RN at 1545, starting CRRT now  Plan:  1. Change famotidine to 20 mg IV q48h 2. Change from Zosyn 3.375 IV q12h to q8h EI 3. Pt received vancomycin 1 g IV x1 this AM, CRRT dosing suggests vancomycin 750 mg IV q24h but pt being cooled. Will continue with current orders for vanc random level in the AM to see where vanc level is before continuing to dose.   Crist Fat L 11/01/2015,11:25 AM

## 2015-11-03 NOTE — Progress Notes (Signed)
Update:   Discussed the CT abdomen and CT head findings with her, both radiologists on call, there appears to have been a bowel perforation, with free air in the abdomen, in addition, there appears to be a large amount of bloodblood seen in the peritoneum, in discussing with the neuroradiologist, CT of the head does not show edema yet at this time, however, there is significant evidence of mechanical insult to the brainstem, in particular the basal ganglia, which would portend a poor prognosis.case was also discussed with nephrology. Discussed the results with the patient's father was at the bedside and related that the patient's prognosis overall is very poor at this time. He would like [place] to try to continue to give him any possible chance and continue aggressive therapies at this time. -Patient patient's father told me that he was with his son my entire night, witnessed the patient's arrest, which started as an apneic episode. He then immediately called 911, and was told to initiate CPR. CPR was then continued by EMS. Upon their arrival.  Currently on physical exam, the patient's blood pressure continues to be significantly elevated, continues to have no corneal reflex, no spontaneous movement,  no pain response, negative oculocephalic reflex, negative Gag, and no coughing on deep suctioning. He does occasionally bite down on the endotracheal tube, and he does appear to be initiating breaths spontaneously.    Plan: -initiate CRRT as previously planned. -start hypothermia protocol at 33. -I have discussed the case with general surgery and neurology.  --NP has discussed case with gastroenterology, no gastroenterologist is on call at this time, however as pt is not stable for EGD, I do not think that facility transfer is necessary at this tim.   Critical Care Attestation.  I have personally obtained a history, examined the patient, evaluated laboratory and imaging results, formulated the assessment  and plan and placed orders. The Patient requires high complexity decision making for assessment and support, frequent evaluation and titration of therapies, application of advanced monitoring technologies and extensive interpretation of multiple databases. The patient has critical illness that could lead imminently to failure of 1 or more organ systems and requires the highest level of physician preparedness to intervene.  Critical Care Time devoted to patient care services described in this note is 45 minutes and is exclusive of time spent in procedures.

## 2015-11-03 NOTE — Progress Notes (Signed)
Received pt from ED to room 6,  S/P cardiac arrest, family found pt unresponsive and started CPR before ems arrived. Pt was intubated in ER. On propofol gtt at 8 mEq. Pt opens eyes with turn and stimulation. Pupils are sluggish. Gen body edema 2-3+ pitting. Report to [employer]

## 2015-11-03 NOTE — ED Notes (Signed)
Lab called with critical values at 0405. Hgb 3.0 and Hct 10.2.

## 2015-11-03 NOTE — Progress Notes (Signed)
Neuro:  Case d/w pt's father over the phone and CT head findings emphasized.  Will discuss care with pt's father in person tomorrow Pauletta Browns

## 2015-11-03 NOTE — Consult Note (Signed)
[facility] CONSULT NOTE    Date: 11/02/2015                  Patient Name:  Jesse Gilbert  MRN: [id number]  DOB: 1991/01/12  Age / Sex: 25 y.o., male         PCP: No primary care provider on file.                 Service Requesting Consult: Dr. Bayard Males                 Reason for Consult: Severe ARF, suspected ESRD            History of Present Illness: Patient is a 25 y.o. male with a PMHx of Long-standing diabetes mellitus type 1 since age 9 and hypertension, who was admitted to [facility] on 10/06/2015 for evaluation of prolonged cardiac arrest. Patient is unable to provide any history at this point in time. The patient's father has provided most of the history. The patient and his family are Amish.  Apparently the patient has been ill since January. He began with a cough which has been protracted in nature. In February of this year the patient started developing edema. Patient has long-standing diabetes mellitus type 1 which has been treated with insulin. However the patient has not had any routine follow-up for his underlying diabetes mellitus. Recently the patient has also had rather poor by mouth intake. Over the past 2 weeks the patient has been having black tarry stools. Early this a.m. the patient was being helped to the toilet. Thereafter the patient apparently passed out. The patient's father then called EMS. Once EMS arrived they found the patient to be in cardiac arrest. His total down time was greater than 1 hour. He received multiple doses of epinephrine as well as atropine.  The patient is now found to have multiple electrolyte derangements. Serum sodium was 134, potassium 6.5, BUN 188, creatinine 12, calcium 8.4. Hemoglobin was also very low at 3.0 with a hematocrit of 10.   Medications: Outpatient medications: On insulin at home.  Current medications: Current Facility-Administered Medications  Medication Dose Route Frequency Provider Last Rate  Last Dose  . 0.9 %  sodium chloride infusion  250 mL Intravenous PRN Lewie Loron, NP      . 0.9 %  sodium chloride infusion   Intravenous Continuous Lewie Loron, NP      . 0.9 %  sodium chloride infusion   Intravenous Once Darci Current, MD      . 0.9 %  sodium chloride infusion   Intravenous Once Darci Current, MD      . acetaminophen (TYLENOL) suppository 650 mg  650 mg Rectal Q6H PRN Lewie Loron, NP      . bisacodyl (DULCOLAX) suppository 10 mg  10 mg Rectal Daily PRN Lewie Loron, NP      . DOPamine (INTROPIN) 800 mg in dextrose 5 % 250 mL (3.2 mg/mL) infusion  10 mcg/kg/min Intravenous Once Darci Current, MD   Stopped at 10/27/2015 0457  . famotidine (PEPCID) IVPB 20 mg premix  20 mg Intravenous Q12H Lewie Loron, NP      . midazolam (VERSED) injection 2 mg  2 mg Intravenous Q15 min PRN Lewie Loron, NP      . midazolam (VERSED) injection 2 mg  2 mg Intravenous Q2H PRN Lewie Loron, NP      . ondansetron (ZOFRAN)  injection 4 mg  4 mg Intravenous Q6H PRN Lewie Loron, NP      . pantoprazole (PROTONIX) 80 mg in sodium chloride 0.9 % 100 mL IVPB  80 mg Intravenous Once Lewie Loron, NP      . pantoprazole (PROTONIX) 80 mg in sodium chloride 0.9 % 250 mL (0.32 mg/mL) infusion  8 mg/hr Intravenous Continuous Lewie Loron, NP      . Melene Muller ON 11/06/2015] pantoprazole (PROTONIX) injection 40 mg  40 mg Intravenous Q12H Magadalene S Tukov, NP      . propofol (DIPRIVAN) 1000 MG/100ML infusion  0-50 mcg/kg/min Intravenous Continuous Magadalene Earlie Lou, NP      . sennosides (SENOKOT) 8.8 MG/5ML syrup 5 mL  5 mL Per Tube BID PRN Lewie Loron, NP      . sodium bicarbonate 150 mEq in dextrose 5 % 1,000 mL infusion   Intravenous Continuous Magadalene Earlie Lou, NP      . sodium bicarbonate injection 100 mEq  100 mEq Intravenous Once Darci Current, MD          Allergies: No Known Allergies    Past Medical History: Past Medical  History:  Diagnosis Date  . Diabetes mellitus without complication (HCC)    Type I DM diagnosed at age 19  . Hypertension      Past Surgical History: History reviewed. No pertinent surgical history.   Family History: No family history of ESRD  Social History: Social History   Social History  . Marital status: Unknown    Spouse name: N/A  . Number of children: N/A  . Years of education: N/A   Occupational History  . Not on file.   Social History Main Topics  . Smoking status: Never Smoker  . Smokeless tobacco: Never Used  . Alcohol use No  . Drug use: No  . Sexual activity: Not on file   Other Topics Concern  . Not on file   Social History Narrative  . No narrative on file     Review of Systems: Patient unable to provide ROS as he's on the ventilator.    Vital Signs: Blood pressure (!) 185/100, pulse 88, temperature (!) 90.9 F (32.7 C), resp. rate (!) 24, height 5\' 6"  (1.676 m), weight 70 kg (154 lb 5.2 oz), SpO2 100 %.  Weight trends: Filed Weights   11/06/2015 0330  Weight: 70 kg (154 lb 5.2 oz)    Physical Exam: General: Critically ill appearing  Head: Facial swelling noted  Eyes: Pupils 3mm and non reactive  Nose: Mucous membranes moist, not inflammed, nonerythematous.  Throat: Endotracheal tube in place  Neck: Supple, trachea midline.  Lungs:  Bilateral rhonchi, normal effort  Heart: S1S2, no obvious pericardial friction rub though exam limited by breath sounds from ventilator  Abdomen:  Mild distension, scant BS, no rebound  Extremities: Anasarca noted, 3+ LE edema  Neurologic: Not following any commands, on propofol however  Skin: Mild licenification of skin b/l LE's    Lab results: Basic Metabolic Panel:  Recent Labs Lab 11/06/15 0350  NA 134*  K 6.5*  CL 99*  CO2 10*  GLUCOSE 333*  BUN 188*  CREATININE 12.93*  CALCIUM 8.4*    Liver Function Tests:  Recent Labs Lab 06-Nov-2015 0350  AST 35  ALT 26  ALKPHOS 92  BILITOT 0.7   PROT 5.1*  ALBUMIN 2.4*   No results for input(s): LIPASE, AMYLASE in the last 168 hours. No results for input(s): AMMONIA  in the last 168 hours.  CBC:  Recent Labs Lab 10/24/2015 0350  WBC 13.3*  NEUTROABS 10.1*  HGB 3.0*  HCT 10.2*  MCV 100.2*  PLT 193    Cardiac Enzymes:  Recent Labs Lab 10/26/2015 0350  TROPONINI 0.17*    BNP: Invalid input(s): POCBNP  CBG:  Recent Labs Lab 10/06/2015 0337  GLUCAP 272*    Microbiology: No results found for this or any previous visit.  Coagulation Studies: No results for input(s): LABPROT, INR in the last 72 hours.  Urinalysis: No results for input(s): COLORURINE, LABSPEC, PHURINE, GLUCOSEU, HGBUR, BILIRUBINUR, KETONESUR, PROTEINUR, UROBILINOGEN, NITRITE, LEUKOCYTESUR in the last 72 hours.  Invalid input(s): APPERANCEUR    Imaging: Dg Chest Port 1 View  Result Date: 10/24/2015 CLINICAL DATA:  25 year old male with cardiac arrest. EXAM: PORTABLE CHEST 1 VIEW COMPARISON:  None. FINDINGS: Endotracheal tube is noted with tip in the right mainstem bronchus. Recommend retraction by approximately 5 cm. Enteric tube courses to the left abdomen with tip beyond the inferior margin of the image. Patchy area of airspace density in the right upper lobe may represent atelectasis versus pneumonia. Small loculated right upper lobe pleural effusion may be present. There is no pneumothorax. Top-normal cardiac silhouette. No acute osseous pathology. IMPRESSION: Endotracheal tube in the right mainstem bronchus. Recommend retraction by approximately 5 cm. Right upper lobe atelectasis versus infiltrate as well as small right pleural effusion, possibly loculated. These results were called by telephone at the time of interpretation on 11/04/2015 at 4:42 am to Dr. Bayard Males , who verbally acknowledged these results. Electronically Signed   By: Elgie Collard M.D.   On: 10/27/2015 04:42     Assessment & Plan: Pt is a 25 y.o. Amish male with a  PMHx of Long-standing diabetes mellitus type 1 since age 29 and hypertension, who was admitted to [facility] on 10/20/2015 for evaluation of prolonged cardiac arrest.  1.  Acute renal failure vs ESRD.  Suspect that this patient is presenting with end-stage renal disease as he is severely anemic and has quite elevated BUN, creatinine, potassium and has had prolonged anasarca. The patient has had long-standing diabetes mellitus type 1 without any significant follow-up. -  The patient presents with a very difficult case. We suspect that he may have underlying end-stage renal disease. He presented with cardiac arrest and hyperkalemia. The patient is in need of renal replacement therapy. I discussed this with the patient's father. Renal placement therapy does pose some risks in this situation given the severity of azotemia. Given severity of illness we will proceed with low efficiency dialysis in the form of continuous renal replacement therapy. We will start the patient on a blood flow rate of 200 with dialysate flow rate 1.5 L. We will use a 0K bath for short period of time until the potassium comes down and then we will switch him over to a 2K bath. We will also proceed with a renal ultrasound to determine his kidney size.  2. Prolonged cardiac arrest/acute respiratory failure. The patient had prolonged cardiac arrest of greater than 1 hour. He is at very high risk for anoxic brain injury. Continue ventilatory support but would recommend CT scan of the head as well.  3. Anemia of chronic kidney disease/acute GI bleed. Patient has been having black tarry stools for the past several weeks. Also suspect that he has anemia from renal insufficiency. Blood transfusions as planned by pulmonary critical care.  4. Hyperkalemia. Serum potassium 6.5 as above. We will be  performing continuous renal replacement therapy as highlighted above.  5. Prognosis quite guarded, discussed with father.

## 2015-11-03 NOTE — Progress Notes (Signed)
Initial Nutrition Assessment  DOCUMENTATION CODES:   Not applicable  INTERVENTION:  1.  Enteral nutrition; once medically appropriate, recommend initiation of PEPup protocol Vital HP @ 40 mL/hr goal with Prostat TID which would provide 1260 kcal, 129 protein, 806 mL water.  Nutrition provided with TF + propofol= 1690 kcal, 129g protein, 806 mL water meeting 95% estimated kcal needs and 92% protein needs.  2.  Glucose control; monitor CBGs.  Patient reportedly takes home insulin.  HgB A1C cannot be obtained- would be inaccurate.  NUTRITION DIAGNOSIS:   Inadequate oral intake related to inability to eat as evidenced by NPO status.  GOAL:   Patient will meet greater than or equal to 90% of their needs, Provide needs based on ASPEN/SCCM guidelines  MONITOR:   Vent status, TF tolerance  REASON FOR ASSESSMENT:   Consult Assessment of nutrition requirement/status  ASSESSMENT:   RD consulted for TF recommendations.   25 y/o [place]ish male with a  H/o T1DM, and hypertension presenting with acute blood loss anemia secondary to prolonged GI bleed, asystole cardiac arrest, acute respiratory failure and acute renal failure. High risk for anoxic brain injury given prolonged downtime, initial Hb=3.  Patient has been infused 4u PBRC with plans to transfuse 2 more.   Patient is currently intubated on ventilator support MV: 8.8 L/min Temp (24hrs), Avg:90.7 F (32.6 C), Min:90.5 F (32.5 C), Max:90.9 F (32.7 C) Note hypothermia affecting needs estimate.  Patient not currently on hypothermic protocol, but with poor temperature regulation due to blood loss/replacement.   Propofol: 16.3 ml/hr providing 430 kcal/d.  Discussed with NP who requests TF recommendations at this time but no orders.  Patient planning for CT today.   Nutrition focused physical exam performed:  Patient determine to be well nourished based on exam.  Note edema present.  Diet Order:  Diet NPO time specified  Skin:   Reviewed, no issues  Last BM:  PTA, dark  Height:   Ht Readings from Last 1 Encounters:  10/18/2015 5\' 6"  (1.676 m)    Weight:   Wt Readings from Last 1 Encounters:  11/04/2015 154 lb 5.2 oz (70 kg)    Ideal Body Weight:     BMI:  Body mass index is 24.91 kg/m.  Estimated Nutritional Needs:   Kcal:  1765  Protein:  140g  Fluid:  2.0 L/d  EDUCATION NEEDS:   Education needs no appropriate at this time  Loyce Dys, MS RD LDN Clinical Inpatient Dietitian Weekend/After hours pager: [phone]

## 2015-11-03 NOTE — ED Notes (Signed)
Luci Bank, NP to bedside to see patient in consult for admission to ICU. Patient's father is at bedside and speaking with the NP at this time.

## 2015-11-03 NOTE — ED Triage Notes (Addendum)
Per [facility], pt's family called 911 with pt in cardiac arrest. EMS reports that family started CPR before first responders arrived. Pt's father reports that pt has had black tarry stools for a couple of days as well as a dry cough since yesterday. Per family pt has hx of HTN and diabetes. EMS reports that pt's CBG was 465. Per EMS, 911 call cam at [phone] and CPR has been in progress since that time with ROSC x2 and 6 epi's.

## 2015-11-03 NOTE — H&P (Signed)
Derran Niue Matthes is an 25 y.o. male.   Chief Complaint: cardiac arrest, pneumoperitoneum HPI: 25 yr old Amish male with PMHx of Type I diabetes and HTN presented to ED today in cardiac arrest.  The history was obtained by speaking with the family (father, mother and sister).   The patient had been sick from a pneumonia with cough for about 72month.  Many people in their community had the same issue and the patient had fever and cough.  He has continued to have the cough but not coughing anything up.  He also has complained of abdominal pain for several weeks as well.  His complaints of pain were mainly in the upper abdomen and he did have pain along is right flank and into his right shoulder as well.   He had been taking white willow bark, which is similar to aspirin and acts as a salicylate and can cause a severe renal failure, about three times a day for at least the past 3 weeks.  Recently he also has been taking increasing amounts of milk thistle and vitamin E, both of which can decrease platlets and increase bleeding risks.    He began to have dark tarry stool about 1 week ago, no frank blood as well as have some vomiting with streaks of blood. He also had increase swelling throughout body over the past few weeks.   He became increasingly more fatigued throughout the week until collapsing.  Father started CPR after calling 911.  The patient was brought to the ED by EMS with glucose of 465 and in active CPR with ROSC x 2 and 6 rounds of epi per ED note. He then went into asystole with 2 doses of Epi and atropine with ROSC. He had a hemoglobin of 3 on arrival, pH 6.86.  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    Past Medical History:  Diagnosis Date  . Diabetes mellitus without complication ([facility]    Type I DM diagnosed at age 25 . Hypertension     Surgical history: no past surgeries  Family History:  no HTN or DM  Social History:  reports that he has never smoked. He has never used smokeless tobacco. He reports that he does not drink alcohol or use drugs.  Allergies: No Known Allergies  No prescriptions prior to admission.    Results for orders placed or performed during the hospital encounter of 10/23/2015 (from the past 48 hour(s))  Glucose, capillary     Status: Abnormal   Collection Time: 10/18/2015  3:37 AM  Result Value Ref Range   Glucose-Capillary 272 (H) 65 - 99 mg/dL  Type and screen     Status: None (Preliminary result)   Collection Time: 10/09/2015  3:50 AM  Result Value Ref Range   ABO/RH(D) O POS    Antibody Screen NEG    Sample Expiration 11/06/2015    Unit Number [id number]   Blood Component Type RBC LR PHER2    Unit division 00    Status of Unit ISSUED    Transfusion Status OK TO TRANSFUSE    Crossmatch Result COMPATIBLE    Unit Number [id number]   Blood Component Type RED CELLS,LR    Unit division 00    Status of Unit ISSUED    Transfusion Status OK TO TRANSFUSE    Crossmatch Result COMPATIBLE    Unit Number [id number]  Blood Component Type RED CELLS,LR    Unit division 00    Status of Unit ISSUED    Transfusion Status OK TO TRANSFUSE    Crossmatch Result Compatible    Unit Number [id number]    Blood Component Type RBC LR PHER2    Unit division 00    Status of Unit ISSUED    Transfusion Status OK TO TRANSFUSE    Crossmatch Result Compatible    Unit Number [id number]    Blood Component Type RED CELLS,LR    Unit division 00    Status of Unit ALLOCATED    Transfusion Status OK TO TRANSFUSE    Crossmatch Result Compatible    Unit Number [id number]    Blood Component Type RED CELLS,LR    Unit division 00    Status of Unit ALLOCATED    Transfusion Status OK TO TRANSFUSE    Crossmatch Result Compatible    Unit Number [id number]    Blood Component Type RED CELLS,LR    Unit division 00    Status of Unit ALLOCATED     Transfusion Status OK TO TRANSFUSE    Crossmatch Result Compatible    Unit Number [id number]    Blood Component Type RED CELLS,LR    Unit division 00    Status of Unit ALLOCATED    Transfusion Status OK TO TRANSFUSE    Crossmatch Result Compatible   CBC     Status: Abnormal   Collection Time: 10/13/2015  3:50 AM  Result Value Ref Range   WBC 13.3 (H) 3.8 - 10.6 K/uL   RBC 1.01 (L) 4.40 - 5.90 MIL/uL   Hemoglobin 3.0 (LL) 13.0 - 18.0 g/dL    Comment: RESULT REPEATED AND VERIFIED CRITICAL RESULT CALLED TO, READ BACK BY AND VERIFIED WITH: KENDALL MOFFITT AT 0400 ON 10/09/2015 RWW    HCT 10.2 (LL) 40.0 - 52.0 %    Comment: RESULT REPEATED AND VERIFIED CRITICAL RESULT CALLED TO, READ BACK BY AND VERIFIED WITH: KENDALL MOFFITT AT 0400 ON 10/31/2015 RWW    MCV 100.2 (H) 80.0 - 100.0 fL   MCH 29.6 26.0 - 34.0 pg   MCHC 29.6 (L) 32.0 - 36.0 g/dL   RDW 16.5 (H) 11.5 - 14.5 %   Platelets 193 150 - 440 K/uL  Differential     Status: Abnormal   Collection Time: 10/24/2015  3:50 AM  Result Value Ref Range   Neutrophils Relative % 75% %   Neutro Abs 10.1 (H) 1.4 - 6.5 K/uL   Lymphocytes Relative 21% %   Lymphs Abs 2.7 1.0 - 3.6 K/uL   Monocytes Relative 3% %   Monocytes Absolute 0.3 0.2 - 1.0 K/uL   Eosinophils Relative 0% %   Eosinophils Absolute 0.1 0 - 0.7 K/uL   Basophils Relative 1% %   Basophils Absolute 0.1 0 - 0.1 K/uL   Smear Review MORPHOLOGY UNREMARKABLE   Comprehensive metabolic panel     Status: Abnormal   Collection Time: 11/01/2015  3:50 AM  Result Value Ref Range   Sodium 134 (L) 135 - 145 mmol/L    Comment: ELECTROLYTES REPEATED   Potassium 6.5 (HH) 3.5 - 5.1 mmol/L    Comment: CRITICAL RESULT CALLED TO, READ BACK BY AND VERIFIED WITH BRYAN GRAY AT 0422 10/10/2015.PMH   Chloride 99 (L) 101 - 111 mmol/L   CO2 10 (L) 22 - 32 mmol/L   Glucose, Bld 333 (H) 65 - 99 mg/dL   BUN 188 (H) 6 - 20 mg/dL  Comment: RESULT CONFIRMED BY MANUAL DILUTION   Creatinine, Ser 12.93 (H) 0.61  - 1.24 mg/dL   Calcium 8.4 (L) 8.9 - 10.3 mg/dL   Total Protein 5.1 (L) 6.5 - 8.1 g/dL   Albumin 2.4 (L) 3.5 - 5.0 g/dL   AST 35 15 - 41 U/L   ALT 26 17 - 63 U/L   Alkaline Phosphatase 92 38 - 126 U/L   Total Bilirubin 0.7 0.3 - 1.2 mg/dL   GFR calc non Af Amer 5 (L) >60 mL/min   GFR calc Af Amer 5 (L) >60 mL/min    Comment: (NOTE) The eGFR has been calculated using the CKD EPI equation. This calculation has not been validated in all clinical situations. eGFR's persistently <60 mL/min signify possible Chronic Kidney Disease.    Anion gap 25 (H) 5 - 15  Troponin I     Status: Abnormal   Collection Time: 10/23/2015  3:50 AM  Result Value Ref Range   Troponin I 0.17 (HH) <0.03 ng/mL    Comment: CRITICAL RESULT CALLED TO, READ BACK BY AND VERIFIED WITH BRYAN GRAY AT 0422 10/14/2015.PMH  Lipid panel     Status: Abnormal   Collection Time: 10/29/2015  3:50 AM  Result Value Ref Range   Cholesterol 56 0 - 200 mg/dL   Triglycerides 103 <150 mg/dL   HDL 18 (L) >40 mg/dL   Total CHOL/HDL Ratio 3.1 RATIO   VLDL 21 0 - 40 mg/dL   LDL Cholesterol 17 0 - 99 mg/dL    Comment:        Total Cholesterol/HDL:CHD Risk Coronary Heart Disease Risk Table                     Men   Women  1/2 Average Risk   3.4   3.3  Average Risk       5.0   4.4  2 X Average Risk   9.6   7.1  3 X Average Risk  23.4   11.0        Use the calculated Patient Ratio above and the CHD Risk Table to determine the patient's CHD Risk.        ATP III CLASSIFICATION (LDL):  <100     mg/dL   Optimal  100-129  mg/dL   Near or Above                    Optimal  130-159  mg/dL   Borderline  160-189  mg/dL   High  >190     mg/dL   Very High   ABO/Rh     Status: None   Collection Time: 10/09/2015  3:50 AM  Result Value Ref Range   ABO/RH(D) O POS   Prepare RBC (crossmatch)     Status: None   Collection Time: 10/16/2015  3:50 AM  Result Value Ref Range   Order Confirmation ORDER PROCESSED BY BLOOD BANK   Blood gas, arterial      Status: Abnormal   Collection Time: 10/21/2015  4:10 AM  Result Value Ref Range   FIO2 1.00    Delivery systems VENTILATOR    Mode ASSIST CONTROL    VT 500 mL   Peep/cpap 5.0 cm H20   pH, Arterial 6.86 (LL) 7.350 - 7.450    Comment: CRITICAL RESULT, NOTIFIED PHYSICIAN  DR Opp BROWN 10/17/2015 0425 KRW    pCO2 arterial 49 (H) 32.0 - 48.0 mmHg   pO2, Arterial 153 (H)  83.0 - 108.0 mmHg   Patient temperature 37.0    Collection site LEFT RADIAL    Sample type ARTERIAL DRAW    Allens test (pass/fail) PASS PASS   Mechanical Rate 18   Prepare RBC     Status: None   Collection Time: 10/27/2015  5:00 AM  Result Value Ref Range   Order Confirmation ORDER PROCESSED BY BLOOD BANK   Blood gas, arterial     Status: Abnormal   Collection Time: 10/16/2015  5:15 AM  Result Value Ref Range   FIO2 1.00    Delivery systems VENTILATOR    Mode ASSIST CONTROL    VT 500 mL   Peep/cpap 5.0 cm H20   pH, Arterial 7.00 (LL) 7.350 - 7.450    Comment: CRITICAL RESULT, NOTIFIED PHYSICIAN  DR [facility] BROWN 11/02/2015 0527 KRW    pCO2 arterial 33 32.0 - 48.0 mmHg   pO2, Arterial 189 (H) 83.0 - 108.0 mmHg   Bicarbonate 8.1 (L) 21.0 - 28.0 mEq/L   Acid-base deficit 21.4 (H) 0.0 - 2.0 mmol/L   O2 Saturation 99.1 %   Patient temperature 37.0    Oxygen index PASS    Collection site RIGHT RADIAL    Sample type ARTERIAL DRAW    Allens test (pass/fail) PASS PASS   Mechanical Rate 18   Prepare RBC     Status: None   Collection Time: 10/28/2015  6:00 AM  Result Value Ref Range   Order Confirmation ORDER PROCESSED BY BLOOD BANK   Protime-INR     Status: Abnormal   Collection Time: 11/04/2015  6:34 AM  Result Value Ref Range   Prothrombin Time 17.9 (H) 11.4 - 15.2 seconds   INR 1.46   APTT     Status: None   Collection Time: 10/19/2015  6:34 AM  Result Value Ref Range   aPTT 33 24 - 36 seconds  Culture, blood (routine x 2)     Status: None (Preliminary result)   Collection Time: 10/18/2015  6:34 AM  Result  Value Ref Range   Specimen Description BLOOD LEFT THUMB    Special Requests BOTTLES DRAWN AEROBIC AND ANAEROBIC  3CC    Culture NO GROWTH < 12 HOURS    Report Status PENDING   CBC     Status: Abnormal   Collection Time: 10/13/2015  6:34 AM  Result Value Ref Range   WBC 18.3 (H) 3.8 - 10.6 K/uL   RBC 2.46 (L) 4.40 - 5.90 MIL/uL   Hemoglobin 7.3 (L) 13.0 - 18.0 g/dL   HCT 21.7 (L) 40.0 - 52.0 %   MCV 88.2 80.0 - 100.0 fL   MCH 29.5 26.0 - 34.0 pg   MCHC 33.4 32.0 - 36.0 g/dL   RDW 16.2 (H) 11.5 - 14.5 %   Platelets 173 150 - 440 K/uL  Prepare RBC     Status: None   Collection Time: 10/11/2015  6:59 AM  Result Value Ref Range   Order Confirmation ORDER PROCESSED BY BLOOD BANK   Culture, blood (routine x 2)     Status: None (Preliminary result)   Collection Time: 11/01/2015  8:40 AM  Result Value Ref Range   Specimen Description BLOOD LINE    Special Requests BOTTLES DRAWN AEROBIC AND ANAEROBIC  [place]    Culture NO GROWTH < 12 HOURS    Report Status PENDING   Triglycerides     Status: None   Collection Time: 10/10/2015  8:40 AM  Result Value Ref Range  Triglycerides 145 <150 mg/dL  CK     Status: Abnormal   Collection Time: 11/02/2015  8:40 AM  Result Value Ref Range   Total CK 874 (H) 49 - 397 U/L  Comprehensive metabolic panel     Status: Abnormal   Collection Time: 11/04/2015  8:40 AM  Result Value Ref Range   Sodium 139 135 - 145 mmol/L    Comment: LYTES REPEATED   Potassium 6.7 (HH) 3.5 - 5.1 mmol/L    Comment: CRITICAL RESULT CALLED TO, READ BACK BY AND VERIFIED WITH: ADAM [facility] 10/28/2015 1014 SGD    Chloride 95 (L) 101 - 111 mmol/L   CO2 13 (L) 22 - 32 mmol/L   Glucose, Bld 240 (H) 65 - 99 mg/dL   BUN 171 (H) 6 - 20 mg/dL    Comment: RESULTS CONFIRMED BY MANUAL DILUTION   Creatinine, Ser 12.43 (H) 0.61 - 1.24 mg/dL   Calcium 8.1 (L) 8.9 - 10.3 mg/dL   Total Protein 5.0 (L) 6.5 - 8.1 g/dL   Albumin 2.4 (L) 3.5 - 5.0 g/dL   AST 51 (H) 15 - 41 U/L   ALT 26 17 - 63 U/L    Alkaline Phosphatase 85 38 - 126 U/L   Total Bilirubin 1.1 0.3 - 1.2 mg/dL   GFR calc non Af Amer 5 (L) >60 mL/min   GFR calc Af Amer 6 (L) >60 mL/min    Comment: (NOTE) The eGFR has been calculated using the CKD EPI equation. This calculation has not been validated in all clinical situations. eGFR's persistently <60 mL/min signify possible Chronic Kidney Disease.    Anion gap 31 (H) 5 - 15  Magnesium     Status: Abnormal   Collection Time: 10/26/2015  8:40 AM  Result Value Ref Range   Magnesium 3.6 (H) 1.7 - 2.4 mg/dL  Procalcitonin     Status: None   Collection Time: 10/30/2015  8:40 AM  Result Value Ref Range   Procalcitonin 1.37 ng/mL    Comment:        Interpretation: PCT > 0.5 ng/mL and <= 2 ng/mL: Systemic infection (sepsis) is possible, but other conditions are known to elevate PCT as well. (NOTE)         ICU PCT Algorithm               Non ICU PCT Algorithm    ----------------------------     ------------------------------         PCT < 0.25 ng/mL                 PCT < 0.1 ng/mL     Stopping of antibiotics            Stopping of antibiotics       strongly encouraged.               strongly encouraged.    ----------------------------     ------------------------------       PCT level decrease by               PCT < 0.25 ng/mL       >= 80% from peak PCT       OR PCT 0.25 - 0.5 ng/mL          Stopping of antibiotics  encouraged.     Stopping of antibiotics           encouraged.    ----------------------------     ------------------------------       PCT level decrease by              PCT >= 0.25 ng/mL       < 80% from peak PCT        AND PCT >= 0.5 ng/mL             Continuing antibiotics                                              encouraged.       Continuing antibiotics            encouraged.    ----------------------------     ------------------------------     PCT level increase compared          PCT > 0.5 ng/mL          with peak PCT AND          PCT >= 0.5 ng/mL             Escalation of antibiotics                                          strongly encouraged.      Escalation of antibiotics        strongly encouraged.   Phosphorus     Status: Abnormal   Collection Time: 10/30/2015  8:40 AM  Result Value Ref Range   Phosphorus 11.8 (H) 2.5 - 4.6 mg/dL  Glucose, capillary     Status: Abnormal   Collection Time: 10/21/2015 10:00 AM  Result Value Ref Range   Glucose-Capillary 279 (H) 65 - 99 mg/dL  Troponin I     Status: Abnormal   Collection Time: 10/30/2015 12:16 PM  Result Value Ref Range   Troponin I 0.47 (HH) <0.03 ng/mL    Comment: CRITICAL RESULT CALLED TO, READ BACK BY AND VERIFIED WITH ADAM SCARBOROUGH 11/02/2015 1253 SGD   Renal function     Status: Abnormal   Collection Time: 10/22/2015 12:16 PM  Result Value Ref Range   Sodium 136 135 - 145 mmol/L    Comment: LYTES REPEATED   Potassium 6.8 (HH) 3.5 - 5.1 mmol/L    Comment: CRITICAL RESULT CALLED TO, READ BACK BY AND VERIFIED WITH ADAM SCARBOROUGH 10/19/2015 1304 SGD    Chloride 99 (L) 101 - 111 mmol/L   CO2 14 (L) 22 - 32 mmol/L   Glucose, Bld 311 (H) 65 - 99 mg/dL   BUN 171 (H) 6 - 20 mg/dL    Comment: RESULT CONFIRMED BY MANUAL DILUTION   Creatinine, Ser 12.73 (H) 0.61 - 1.24 mg/dL   Calcium 7.7 (L) 8.9 - 10.3 mg/dL   Phosphorus 12.0 (H) 2.5 - 4.6 mg/dL   Albumin 2.5 (L) 3.5 - 5.0 g/dL   GFR calc non Af Amer 5 (L) >60 mL/min   GFR calc Af Amer 5 (L) >60 mL/min    Comment: (NOTE) The eGFR has been calculated using the CKD EPI equation. This calculation has not been validated in all clinical situations. eGFR's persistently <60 mL/min signify possible Chronic Kidney  Disease.    Anion gap 23 (H) 5 - 15  Magnesium     Status: Abnormal   Collection Time: 10/08/2015 12:18 PM  Result Value Ref Range   Magnesium 3.5 (H) 1.7 - 2.4 mg/dL  Glucose, capillary     Status: Abnormal   Collection Time: 10/18/2015 12:34 PM  Result Value Ref Range    Glucose-Capillary 331 (H) 65 - 99 mg/dL  Arterial Blood Gas     Status: Abnormal (Preliminary result)   Collection Time: 10/31/2015  1:05 PM  Result Value Ref Range   FIO2 0.50    Delivery systems VENTILATOR    Mode PRESSURE REGULATED VOLUME CONTROL    VT 500 mL   LHR 18 resp/min   Peep/cpap 5.0 cm H20   Inspiratory PAP PENDING    Expiratory PAP PENDING    pH, Arterial 7.28 (L) 7.350 - 7.450   pCO2 arterial 27 (L) 32.0 - 48.0 mmHg   pO2, Arterial 69 (L) 83.0 - 108.0 mmHg   Bicarbonate 12.7 (L) 21.0 - 28.0 mEq/L   Acid-base deficit 12.8 (H) 0.0 - 2.0 mmol/L   O2 Saturation 90.9 %   Patient temperature 37.0    Oxygen index PENDING    Collection site A-LINE    Sample type ARTERIAL DRAW    Allens test (pass/fail) PENDING PASS  Troponin I     Status: Abnormal   Collection Time: 10/19/2015  2:11 PM  Result Value Ref Range   Troponin I 0.56 (HH) <0.03 ng/mL    Comment: CRITICAL VALUE NOTED. VALUE IS CONSISTENT WITH PREVIOUSLY REPORTED/CALLED VALUE  Basic metabolic panel     Status: Abnormal   Collection Time: 10/25/2015  2:11 PM  Result Value Ref Range   Sodium 138 135 - 145 mmol/L    Comment: LYTES REPEATED   Potassium 6.9 (HH) 3.5 - 5.1 mmol/L    Comment: CRITICAL RESULT CALLED TO, READ BACK BY AND VERIFIED WITH ADAM SCARBOROUGH 10/18/2015 1450 SGD    Chloride 95 (L) 101 - 111 mmol/L   CO2 13 (L) 22 - 32 mmol/L   Glucose, Bld 336 (H) 65 - 99 mg/dL   BUN 176 (H) 6 - 20 mg/dL    Comment: RESULT CONFIRMED BY MANUAL DILUTION   Creatinine, Ser 12.35 (H) 0.61 - 1.24 mg/dL   Calcium 8.0 (L) 8.9 - 10.3 mg/dL   GFR calc non Af Amer 5 (L) >60 mL/min   GFR calc Af Amer 6 (L) >60 mL/min    Comment: (NOTE) The eGFR has been calculated using the CKD EPI equation. This calculation has not been validated in all clinical situations. eGFR's persistently <60 mL/min signify possible Chronic Kidney Disease.    Anion gap 30 (H) 5 - 15  Protime-INR now and repeat in 8 hours     Status: Abnormal    Collection Time: 10/28/2015  2:11 PM  Result Value Ref Range   Prothrombin Time 18.6 (H) 11.4 - 15.2 seconds   INR 1.54   APTT now and repeat in 8 hours     Status: None   Collection Time: 10/31/2015  2:11 PM  Result Value Ref Range   aPTT 36 24 - 36 seconds  CBC     Status: Abnormal   Collection Time: 10/19/2015  3:00 PM  Result Value Ref Range   WBC 12.5 (H) 3.8 - 10.6 K/uL   RBC 2.05 (L) 4.40 - 5.90 MIL/uL   Hemoglobin 6.0 (L) 13.0 - 18.0 g/dL   HCT 17.3 (L)  40.0 - 52.0 %   MCV 84.6 80.0 - 100.0 fL   MCH 29.4 26.0 - 34.0 pg   MCHC 34.7 32.0 - 36.0 g/dL   RDW 16.5 (H) 11.5 - 14.5 %   Platelets 145 (L) 150 - 440 K/uL  Renal function     Status: Abnormal   Collection Time: 10/24/2015  3:00 PM  Result Value Ref Range   Sodium 137 135 - 145 mmol/L    Comment: RESULTS VERIFIED BY REPEAT TESTING   Potassium 6.8 (HH) 3.5 - 5.1 mmol/L    Comment: CRITICAL RESULT CALLED TO, READ BACK BY AND VERIFIED WITH: ADAM SCARBOROUGH AT 1553 11/01/2015 SDR    Chloride 96 (L) 101 - 111 mmol/L   CO2 13 (L) 22 - 32 mmol/L    Comment: CORRECTED ON 07/29 AT 1557: PREVIOUSLY REPORTED AS 13 CRITICAL RESULT CALLED TO, READ BACK BY AND VERIFIED WITH:  ADAM SCARBOROUGH AT 1553 10/27/2015 SDR   Glucose, Bld 345 (H) 65 - 99 mg/dL   BUN 171 (H) 6 - 20 mg/dL    Comment: RESULT CONFIRMED BY MANUAL DILUTION   Creatinine, Ser 12.29 (H) 0.61 - 1.24 mg/dL   Calcium 7.8 (L) 8.9 - 10.3 mg/dL   Phosphorus 11.1 (H) 2.5 - 4.6 mg/dL   Albumin 2.3 (L) 3.5 - 5.0 g/dL   GFR calc non Af Amer 5 (L) >60 mL/min   GFR calc Af Amer 6 (L) >60 mL/min    Comment: (NOTE) The eGFR has been calculated using the CKD EPI equation. This calculation has not been validated in all clinical situations. eGFR's persistently <60 mL/min signify possible Chronic Kidney Disease.    Anion gap 28 (H) 5 - 15  Magnesium     Status: Abnormal   Collection Time: 10/18/2015  3:00 PM  Result Value Ref Range   Magnesium 3.3 (H) 1.7 - 2.4 mg/dL   Ct Abdomen  Pelvis Wo Contrast  Result Date: 10/06/2015 CLINICAL DATA:  Acute blood loss and anemia secondary to prolonged GI bleed. Asystole with prolonged hypoxia. Type 1 diabetes. EXAM: CT ABDOMEN AND PELVIS WITHOUT CONTRAST TECHNIQUE: Multidetector CT imaging of the abdomen and pelvis was performed following the standard protocol without IV contrast. COMPARISON:  Two-view abdomen from the same day. FINDINGS: Lower chest: Left greater than right pleural effusions are present. Left greater than right lower lobe airspace disease is present as well. There is increase consolidation on the left. The heart is mildly enlarged. Anemia is noted. No significant pericardial effusion is present. Hepatobiliary: No focal hepatic lesions are present. The common bile duct is within normal limits. Inflammatory changes surround the gallbladder. Pancreas: Inflammatory changes present at the head and proximal body of the pancreas no discrete mass or cyst is present. Spleen: The spleen is small.  No focal lesions are evident. Adrenals/Urinary Tract: Adrenal glands are within normal limits. The kidneys and ureters are unremarkable. There is no obstruction or stone. A Foley catheter is present within the urinary bladder. Stomach/Bowel: NG tube is in place. The stomach and duodenum demonstrate no focal lesions. There is diffuse mesenteric edema. Fluid levels are present within nondilated loops of bowel. At least 1 locule of free air is present in the abdomen on image 37 of series 3. Other small loculated are suspected in the right upper quadrant as well. Vascular/Lymphatic: Bilateral femoral lines are in place. Marked hypoattenuation in the aorta is compatible with severe anemia. No focal vascular lesions are present. There is no significant adenopathy. Multiple small  nodes are present. Reproductive: Within normal limits. Other: Extensive high-density abdominal ascites are present. This is compatible with blood products. Musculoskeletal: Bone  windows demonstrate no focal lytic or blastic lesions. IMPRESSION: 1. Diffuse hemoperitoneum. 2. Bronchials of free air compatible with bowel perforation hand pneumoperitoneum. 3. Marked anemia. 4. Poor definition of pancreatic head and proximal pancreas may reflect inflammatory change. 5. Large bilateral pleural effusions and associated airspace disease, left greater than right. These results were called by telephone at the time of interpretation on 10/16/2015 at 12:14 pm to Dr. Ashby Dawes, who verbally acknowledged these results. Electronically Signed   By: San Morelle M.D.   On: 10/20/2015 12:16  Dg Abd 1 View  Result Date: 10/19/2015 CLINICAL DATA:  Evaluate NG tube placement. EXAM: ABDOMEN - 1 VIEW COMPARISON:  None. FINDINGS: The OG tube terminates in the stomach. No other acute abnormalities. IMPRESSION: Appropriate placement of OG tube. Electronically Signed   By: Dorise Bullion III M.D   On: 10/08/2015 09:48  Ct Head Wo Contrast  Result Date: 10/26/2015 CLINICAL DATA:  25 year old Amish male with acute blood loss. Hemoglobin of 3. Hypertension. EXAM: CT HEAD WITHOUT CONTRAST TECHNIQUE: Contiguous axial images were obtained from the base of the skull through the vertex without intravenous contrast. COMPARISON:  None. FINDINGS: Scattered ethmoid opacification. Paranasal sinuses, mastoid air cells, middle ears, and bones are otherwise within normal limits. Scalp edema identified. Extracranial soft tissues are otherwise within normal limits. No subdural, epidural, or subarachnoid hemorrhage. Low density in the dural venous sinuses consistent with a low hemoglobin. No midline shift. Ventricles and sulci are unremarkable. The cerebellum and brainstem are normal. Basal cisterns are not yet effaced. Low attenuation is seen in the basal ganglia and both the right/left thalamus. Low attenuation is seen in the posterior white matter with involvement of overlying cortex. A similar finding but less  prominent is seen anteriorly in the frontal lobes. No midline shift. IMPRESSION: 1. The findings are consistent with the ischemia/infarct in the basal ganglia and both the right and left thalamus. These findings are consistent with anoxic injury. 2. The low-attenuation seen posteriorly greater than anteriorly in the white matter and cortex is suggestive of posterior reversible encephalopathy. Given the anterior involvement, watershed infarcts are also possible. No sulcal or basilar cistern effacement at this time. No intracranial hemorrhage. Findings called to Dr. Ashby Dawes Electronically Signed   By: Dorise Bullion III M.D   On: 10/21/2015 12:17  Dg Chest Port 1 View  Result Date: 10/26/2015 CLINICAL DATA:  Cardiac arrest EXAM: PORTABLE CHEST 1 VIEW COMPARISON:  November 03, 2015 FINDINGS: A left central line has been placed. The distal tip is coiled back on itself. Recommend repositioning. Transcutaneous pacers remain. An ET tube is in good position. The NG tube terminates below today's film. No pneumothorax. No overt edema. No pulmonary nodules or masses. Opacity is not excluded in the left retrocardiac region on this study. IMPRESSION: 1. The distal tip of the left subclavian line coils back on itself. Recommend repositioning. Other support apparatus in good position within visualize limits. 2. No pneumothorax after line placement. 3. Transcutaneous pacers obscure the left base but opacity, possibly atelectasis, is not excluded in the retrocardiac region. Electronically Signed   By: Dorise Bullion III M.D   On: 10/25/2015 08:47  Dg Chest Port 1 View  Result Date: 10/25/2015 CLINICAL DATA:  25 year old male with cardiac arrest. EXAM: PORTABLE CHEST 1 VIEW COMPARISON:  None. FINDINGS: Endotracheal tube is noted with tip in the right  mainstem bronchus. Recommend retraction by approximately 5 cm. Enteric tube courses to the left abdomen with tip beyond the inferior margin of the image. Patchy area of  airspace density in the right upper lobe may represent atelectasis versus pneumonia. Small loculated right upper lobe pleural effusion may be present. There is no pneumothorax. Top-normal cardiac silhouette. No acute osseous pathology. IMPRESSION: Endotracheal tube in the right mainstem bronchus. Recommend retraction by approximately 5 cm. Right upper lobe atelectasis versus infiltrate as well as small right pleural effusion, possibly loculated. These results were called by telephone at the time of interpretation on 10/22/2015 at 4:42 am to Dr. Marjean Donna , who verbally acknowledged these results. Electronically Signed   By: Anner Crete M.D.   On: 10/16/2015 04:42   Review of Systems  Unable to perform ROS: Critical illness  Constitutional: Positive for chills, diaphoresis, malaise/fatigue and weight loss.  Respiratory: Positive for cough and shortness of breath. Negative for sputum production.   Cardiovascular: Positive for leg swelling.  Gastrointestinal: Positive for abdominal pain, blood in stool, diarrhea, heartburn, melena, nausea and vomiting.  Genitourinary: Positive for flank pain.  Musculoskeletal: Positive for back pain.  Neurological: Positive for loss of consciousness and weakness.    Blood pressure (!) 176/89, pulse 81, temperature (!) 94.1 F (34.5 C), resp. rate (!) 26, height 5' 6"  (1.676 m), weight 154 lb 5.2 oz (70 kg), SpO2 94 %. Physical Exam  Vitals reviewed. Constitutional: He appears well-developed and well-nourished.  GCS 3T; negative corneal, cough and gag   HENT:  Nose: Nose normal.  Mouth/Throat: Oropharynx is clear and moist. No oropharyngeal exudate.  Edema in entire face and eyelids  Eyes: No scleral icterus.  Pupils dilated and fixed   Neck: Neck supple. No tracheal deviation present.  Cardiovascular: Normal rate, regular rhythm, normal heart sounds and intact distal pulses.  Exam reveals no gallop and no friction rub.   No murmur  heard. Respiratory: He has wheezes. He has rales.  On vent, crackles in bilateral bases left >R  GI: Soft. Bowel sounds are normal. He exhibits distension. There is no tenderness. There is no rebound and no guarding.  Abdomen distended, no response to noxious stimuli,  Musculoskeletal: He exhibits edema. He exhibits no deformity.  Lymphadenopathy:    He has no cervical adenopathy.  Neurological: He is unresponsive. GCS eye subscore is 1. GCS verbal subscore is 1. GCS motor subscore is 1.  Skin: Skin is dry. No rash noted. No erythema.     Assessment/Plan 25 yr old male with Type I DM, HTN, who came in the ED in cardiac arrest after likely CAP complicated by GI bleed, likely due in part to supplements and sustained anoxic brain injury.   I have personally reviwed his laboratory values which show increased Cr to 12, hyperkalemia, hypermagnesemia, severe acidosis and acute blood loss with initial Hgb 3 and after 4 units of blood steady at 6.5. I have personally reviewed his CT scan images showing edema and infarct to brain as well as the CT scan abd/pelivs with hemoperitoneum and possibly some pneumoperitoneum.      I had an extensive discussion with the family >62mnutes about his poor prognosis from a neurologic standpoint.  I explained that an operative intervention to help with the GI bleed would not help the neurologic function and likely could decrease it.  Given the poor prognosis no surgical intervention will be planned.  I did inform the family that if he were to begin to have  neurologic function again that an exploration of the abdomen could be offered at that time; however this is unlikely.    Hubbard Robinson, MD 10/10/2015, 4:01 PM

## 2015-11-03 NOTE — Progress Notes (Signed)
Spoke with Dr. Katrinka Blazing at [facility].  Dr. Katrinka Blazing in agreement to hold Nimbex in this situation.  Will keep order to in case patient begins to shiver overnight.

## 2015-11-03 NOTE — Progress Notes (Signed)
Pharmacy Antibiotic Note  Jesse Gilbert is a 25 y.o. male admitted on November 24, 2015 with sepsis.  Pharmacy has been consulted for vancomycin and Zosyn dosing.  Plan: DW 70kg  Vd 49L kei 0.01 hr-1  T1/2 63 hours. Vancomycin 1 gram x1 ordered. Level ordered with tomorrow AM labs. PRN order in as place holder to redose when vanc is at acceptable level i.e. < 20. May be able to schedule dosing as patient condition improves.  Zosyn 3.375 grams q 12 hours ordered.  Height: 5\' 6"  (167.6 cm) Weight: 154 lb 5.2 oz (70 kg) IBW/kg (Calculated) : 63.8  Temp (24hrs), Avg:90.7 F (32.6 C), Min:90.5 F (32.5 C), Max:90.9 F (32.7 C)   Recent Labs Lab 2015-11-24 0350 11/24/15 0634  WBC 13.3* 18.3*  CREATININE 12.93*  --     Estimated Creatinine Clearance: 7.8 mL/min (by C-G formula based on SCr of 12.93 mg/dL).    No Known Allergies  Antimicrobials this admission: vancomycin  >>  Zosyn  >>   Dose adjustments this admission:   Microbiology results: 7/29 BCx: pending 7/29 UCx: pending  7/29 Sputum: pending    7/29 UA: pending 7/29 CXR: RUL atelectasis vs. infiltrate  Thank you for allowing pharmacy to be a part of this patient's care.  Lynix Bonine S Nov 24, 2015 7:43 AM

## 2015-11-03 NOTE — ED Provider Notes (Signed)
[facility] Emergency Department Provider Note  ____________________________________________  Time seen: 3:35 AM  I have reviewed the triage vital signs and the nursing notes.   HISTORY  Chief Complaint Cardiac Arrest     HPI Jesse Gilbert is a 25 y.o. male presents via [facility] cardiac arrest. Per EMS initial call was for unresponsiveness. On their arrival the patient was found to be apneic and pulseless CPR started patient received 6 doses of epinephrine in route with return of spontaneous circulation. Shortly after arriving to the emergency department patient became asystolic. Patient received 2 doses of IV epinephrine as well as 2 doses of atropine with return of circulation. Patient's father presented and stated patient has had a few weeks of dark stools which he describes as "black". He also admits that the patient is a progressive swelling over the past few weeks.    Past Medical History:  Diagnosis Date  . Diabetes mellitus without complication (HCC)    Type I DM diagnosed at age 47  . Hypertension     Patient Active Problem List   Diagnosis Date Noted  . Cardiac arrest (HCC) 10/24/2015  . Acute blood loss anemia 11/02/2015  . Acute GI bleeding 10/26/2015    Past surgical history None   Allergies No known drug allergies History reviewed. No pertinent family history.  Social History Social History  Substance Use Topics  . Smoking status: Never Smoker  . Smokeless tobacco: Never Used  . Alcohol use No    Review of Systems  Constitutional: Negative for fever. Eyes: Negative for visual changes. ENT: Negative for sore throat. Cardiovascular: Negative for chest pain. Respiratory: Negative for shortness of breath. Gastrointestinal: Negative for abdominal pain, vomiting and diarrhea. Genitourinary: Negative for dysuria. Musculoskeletal: Negative for back pain. Skin: Negative for rash.Pallor Neurological: Negative for  headaches, focal weakness or numbness.   10-point ROS otherwise negative.  ____________________________________________   PHYSICAL EXAM:  VITAL SIGNS: ED Triage Vitals  Enc Vitals Group     BP 10/19/2015 0330 132/65     Pulse Rate 10/15/2015 0330 (!) 101     Resp 10/07/2015 0330 (!) 27     Temp 11/01/2015 0457 (!) 90.6 F (32.6 C)     Temp Source 10/27/2015 0457 Rectal     SpO2 10/10/2015 0330 100 %     Weight 10/10/2015 0330 154 lb 5.2 oz (70 kg)     Height 10/27/2015 0330  (1.676 m)     Head Circumference --      Peak Flow --      Pain Score --      Pain Loc --      Pain Edu? --      Excl. in GC? --     Constitutional: Unresponsive rno corneal reflex Eyes: Pale conjunctiva. Pupils fixed  ENT   Head: Normocephalic and atraumatic.   Nose: No congestion/rhinnorhea.   Mouth/Throat: Mucous membranes are moist. Very pale oral mucosa   Neck: No stridor. Hematological/Lymphatic/Immunilogical: No cervical lymphadenopathy. Cardiovascular: Normal rate, regular rhythm. Normal and symmetric distal pulses are present in all extremities. No murmurs, rubs, or gallops. Respiratory: Normal respiratory effort without tachypnea nor retractions. Breath sounds are clear and equal bilaterally. No wheezes/rales/rhonchi. Gastrointestinal: Soft and nontender. No distention. There is no CVA tenderness. Guaiac positive stools Genitourinary: deferred Musculoskeletal: Nontender with normal range of motion in all extremities. Generalized 2+ pitting edema  Neurologic:  No corneal reflex/no gag reflex. He does not respond to painful stimuli  Skin:  Skin is warm, dry and intact. Pale   ____________________________________________    LABS (pertinent positives/negatives)  Labs Reviewed  GLUCOSE, CAPILLARY - Abnormal; Notable for the following:       Result Value   Glucose-Capillary 272 (*)    All other components within normal limits  CBC - Abnormal; Notable for the following:    WBC 13.3  (*)    RBC 1.01 (*)    Hemoglobin 3.0 (*)    HCT 10.2 (*)    MCV 100.2 (*)    MCHC 29.6 (*)    RDW 16.5 (*)    All other components within normal limits  DIFFERENTIAL - Abnormal; Notable for the following:    Neutro Abs 10.1 (*)    All other components within normal limits  COMPREHENSIVE METABOLIC PANEL - Abnormal; Notable for the following:    Sodium 134 (*)    Potassium 6.5 (*)    Chloride 99 (*)    CO2 10 (*)    Glucose, Bld 333 (*)    BUN 188 (*)    Creatinine, Ser 12.93 (*)    Calcium 8.4 (*)    Total Protein 5.1 (*)    Albumin 2.4 (*)    GFR calc non Af Amer 5 (*)    GFR calc Af Amer 5 (*)    Anion gap 25 (*)    All other components within normal limits  TROPONIN I - Abnormal; Notable for the following:    Troponin I 0.17 (*)    All other components within normal limits  LIPID PANEL - Abnormal; Notable for the following:    HDL 18 (*)    All other components within normal limits  BLOOD GAS, ARTERIAL - Abnormal; Notable for the following:    pH, Arterial 6.86 (*)    pCO2 arterial 49 (*)    pO2, Arterial 153 (*)    All other components within normal limits  BLOOD GAS, ARTERIAL - Abnormal; Notable for the following:    pH, Arterial 7.00 (*)    pO2, Arterial 189 (*)    Bicarbonate 8.1 (*)    Acid-base deficit 21.4 (*)    All other components within normal limits  CULTURE, BLOOD (ROUTINE X 2)  CULTURE, BLOOD (ROUTINE X 2)  URINE CULTURE  CULTURE, RESPIRATORY (NON-EXPECTORATED)  PROTIME-INR  APTT  CBC  CBC  CBC  URINALYSIS, ROUTINE W REFLEX MICROSCOPIC (NOT AT [facility])  MAGNESIUM  PHOSPHORUS  PROCALCITONIN  TROPONIN I  TROPONIN I  TROPONIN I  CBC  BASIC METABOLIC PANEL  BLOOD GAS, ARTERIAL  MAGNESIUM  PHOSPHORUS  TRIGLYCERIDES  TYPE AND SCREEN  ABO/RH  PREPARE RBC (CROSSMATCH)  PREPARE RBC (CROSSMATCH)  TYPE AND SCREEN  PREPARE RBC (CROSSMATCH)  PREPARE RBC (CROSSMATCH)        Procedure(s) performed: INTUBATION Performed by: Darci Current  Required items: required blood products, implants, devices, and special equipment available Patient identity confirmed: provided demographic data and hospital-assigned identification number Time out: Immediately prior to procedure a "time out" was called to verify the correct patient, procedure, equipment, support staff and site/side marked as required.  Indications: Respiratory Failure and cardiac arrest   Intubation method: Glidescope Laryngoscopy   Preoxygenation: BVM  Sedatives: none Paralytic: None  Tube Size: 7 cuffed  Post-procedure assessment: chest rise and ETCO2 monitor Breath sounds: equal and absent over the epigastrium Tube secured with: ETT holder Chest x-ray interpreted by radiologist and me.  Chest x-ray findings: endotracheal tube right mainstem  and pulled back approximately 3 cm   Patient tolerated the procedure well with no immediate complications.     Procedures   Critical Care performed: CRITICAL CARE Performed by: Darci Current   Total critical care time: 90 minutes  Critical care time was exclusive of separately billable procedures and treating other patients.  Critical care was necessary to treat or prevent imminent or life-threatening deterioration.  Critical care was time spent personally by me on the following activities: development of treatment plan with patient and/or surrogate as well as nursing, discussions with consultants, evaluation of patient's response to treatment, examination of patient, obtaining history from patient or surrogate, ordering and performing treatments and interventions, ordering and review of laboratory studies, ordering and review of radiographic studies, pulse oximetry and re-evaluation of patient's condition.   ____________________________________________   INITIAL IMPRESSION / ASSESSMENT AND PLAN / ED COURSE  Pertinent labs & imaging results that were available during my care of the patient were  reviewed by me and considered in my medical decision making (see chart for details).    ____________________________________________   FINAL CLINICAL IMPRESSION(S) / ED DIAGNOSES  Final diagnoses:  Cardiac arrest (HCC)  Renal failure Severe anemia Gastrointestinal bleeding    Darci Current, MD 10/10/[phone]

## 2015-11-03 NOTE — Procedures (Signed)
-----------  3 PROCEDURES DOCUMENTED IN THIS NOTE-----------     Procedure Note:  Arterial Line Placement Jesse Gilbert , [id number] , [id number]  Indications:  Hemodynamic instability / recurrent ABG draws   Time out was performed verifying correct patient, procedure, site, positioning, and special catheter was available at the time of procedure.  Patient's LEFT FEMORAL artery was prepped and draped in usual sterile fashion. Total number of attempts were 1.  A 20 gauge arterial line was introduced into the LEFT FEMORAL artery.  Catheter threaded and the needle was removed with appropriate blood return.  Blood loss was minimal.  Patient tolerated the procedure well, and there were no complications.    Shane Crutch, MD 10/16/2015  [facility] Pager - [phone] On Call Pager [phone]    Central Venous Catheter Placement: Indication: Patient receiving vesicant or irritant drug.; Patient receiving intravenous therapy for longer than 5 days.; Patient has limited or no vascular access.   Consent:EMERGENT.   Risks and benefits explained in detail including risk of infection, bleeding, respiratory failure and death..   Hand washing performed prior to starting the procedure.   Procedure: An active timeout was performed and correct patient, name, & ID confirmed.  After explaining risk and benefits, patient was positioned correctly for central venous access. Patient was prepped using strict sterile technique including chlorohexadine preps, sterile drape, sterile gown and sterile gloves.  The area was prepped, draped and anesthetized in the usual sterile manner. Patient comfort was obtained.  A triple lumen catheter was placed in LEFT  SUBCLAVIAN Vein There was good blood return, catheter caps were placed on lumens, catheter flushed easily, the line was secured and a sterile dressing and BIO-PATCH applied.    Number of Attempts:  1 Complications:none Estimated Blood Loss: none Chest Radiograph indicated and ordered.  Operator: Wells Guiles, M.D.   Wells Guiles, M.D.  10/29/2015   Central Venous Dailysis Catheter Placement: Indication: Hemo Dialysis/CRRT   Consent:EMERGENT.   Risks and benefits explained in detail including risk of infection, bleeding, respiratory failure and death..   Hand washing performed prior to starting the procedure.   Procedure: An active timeout was performed and correct patient, name, & ID confirmed.  After explaining risk and benefits, patient was positioned correctly for central venous access. Patient was prepped using strict sterile technique including chlorohexadine preps, sterile drape, sterile gown and sterile gloves.  The area was prepped, draped and anesthetized in the usual sterile manner. Patient comfort was obtained.  An HD catheter was placed in right femoral Vein There was good blood return, catheter caps were placed on lumens, catheter flushed easily, the line was secured and a sterile dressing and BIO-PATCH applied.   Ultrasound was used to visualize vasculature and guidance of needle.   Number of Attempts: 2 Complications:none  Estimated Blood Loss: 20 cc    Deep Nicholos Johns, M.D.  10/13/2015

## 2015-11-04 ENCOUNTER — Inpatient Hospital Stay: Payer: Self-pay

## 2015-11-04 LAB — RENAL FUNCTION PANEL
ALBUMIN: 1.9 g/dL — AB (ref 3.5–5.0)
ALBUMIN: 2.2 g/dL — AB (ref 3.5–5.0)
ALBUMIN: 2.2 g/dL — AB (ref 3.5–5.0)
ANION GAP: 11 (ref 5–15)
ANION GAP: 13 (ref 5–15)
ANION GAP: 20 — AB (ref 5–15)
Albumin: 2.3 g/dL — ABNORMAL LOW (ref 3.5–5.0)
Albumin: 2.4 g/dL — ABNORMAL LOW (ref 3.5–5.0)
Albumin: 1.9 g/dL — ABNORMAL LOW (ref 3.5–5.0)
Albumin: 1.9 g/dL — ABNORMAL LOW (ref 3.5–5.0)
Anion gap: 19 — ABNORMAL HIGH (ref 5–15)
Anion gap: 18 — ABNORMAL HIGH (ref 5–15)
Anion gap: 16 — ABNORMAL HIGH (ref 5–15)
Anion gap: 16 — ABNORMAL HIGH (ref 5–15)
BUN: 105 mg/dL — AB (ref 6–20)
BUN: 120 mg/dL — AB (ref 6–20)
BUN: 120 mg/dL — AB (ref 6–20)
BUN: 133 mg/dL — AB (ref 6–20)
BUN: 147 mg/dL — ABNORMAL HIGH (ref 6–20)
BUN: 89 mg/dL — ABNORMAL HIGH (ref 6–20)
BUN: 97 mg/dL — ABNORMAL HIGH (ref 6–20)
CALCIUM: 7.8 mg/dL — AB (ref 8.9–10.3)
CALCIUM: 7.4 mg/dL — AB (ref 8.9–10.3)
CALCIUM: 7.6 mg/dL — AB (ref 8.9–10.3)
CHLORIDE: 99 mmol/L — AB (ref 101–111)
CHLORIDE: 101 mmol/L (ref 101–111)
CHLORIDE: 100 mmol/L — AB (ref 101–111)
CHLORIDE: 106 mmol/L (ref 101–111)
CO2: 18 mmol/L — ABNORMAL LOW (ref 22–32)
CO2: 20 mmol/L — AB (ref 22–32)
CO2: 20 mmol/L — ABNORMAL LOW (ref 22–32)
CO2: 21 mmol/L — AB (ref 22–32)
CO2: 22 mmol/L (ref 22–32)
CO2: 22 mmol/L (ref 22–32)
CO2: 23 mmol/L (ref 22–32)
CREATININE: 9.16 mg/dL — AB (ref 0.61–1.24)
CREATININE: 8.34 mg/dL — AB (ref 0.61–1.24)
CREATININE: 8.3 mg/dL — AB (ref 0.61–1.24)
Calcium: 7.4 mg/dL — ABNORMAL LOW (ref 8.9–10.3)
Calcium: 7.6 mg/dL — ABNORMAL LOW (ref 8.9–10.3)
Calcium: 7.7 mg/dL — ABNORMAL LOW (ref 8.9–10.3)
Calcium: 8.1 mg/dL — ABNORMAL LOW (ref 8.9–10.3)
Chloride: 100 mmol/L — ABNORMAL LOW (ref 101–111)
Chloride: 102 mmol/L (ref 101–111)
Chloride: 98 mmol/L — ABNORMAL LOW (ref 101–111)
Creatinine, Ser: 10.52 mg/dL — ABNORMAL HIGH (ref 0.61–1.24)
Creatinine, Ser: 6.41 mg/dL — ABNORMAL HIGH (ref 0.61–1.24)
Creatinine, Ser: 6.8 mg/dL — ABNORMAL HIGH (ref 0.61–1.24)
Creatinine, Ser: 7.54 mg/dL — ABNORMAL HIGH (ref 0.61–1.24)
GFR calc Af Amer: 8 mL/min — ABNORMAL LOW (ref >60)
GFR calc Af Amer: 9 mL/min — ABNORMAL LOW (ref >60)
GFR calc Af Amer: 13 mL/min — ABNORMAL LOW (ref >60)
GFR calc non Af Amer: 7 mL/min — ABNORMAL LOW (ref >60)
GFR calc non Af Amer: 10 mL/min — ABNORMAL LOW (ref >60)
GFR, EST AFRICAN AMERICAN: 10 mL/min — AB (ref >60)
GFR, EST AFRICAN AMERICAN: 12 mL/min — AB (ref >60)
GFR, EST AFRICAN AMERICAN: 7 mL/min — AB (ref >60)
GFR, EST AFRICAN AMERICAN: 9 mL/min — AB (ref >60)
GFR, EST NON AFRICAN AMERICAN: 11 mL/min — AB (ref >60)
GFR, EST NON AFRICAN AMERICAN: 6 mL/min — AB (ref >60)
GFR, EST NON AFRICAN AMERICAN: 8 mL/min — AB (ref >60)
GFR, EST NON AFRICAN AMERICAN: 8 mL/min — AB (ref >60)
GFR, EST NON AFRICAN AMERICAN: 9 mL/min — AB (ref >60)
GLUCOSE: 315 mg/dL — AB (ref 65–99)
GLUCOSE: 296 mg/dL — AB (ref 65–99)
GLUCOSE: 298 mg/dL — AB (ref 65–99)
GLUCOSE: 211 mg/dL — AB (ref 65–99)
GLUCOSE: 170 mg/dL — AB (ref 65–99)
Glucose, Bld: 281 mg/dL — ABNORMAL HIGH (ref 65–99)
Glucose, Bld: 341 mg/dL — ABNORMAL HIGH (ref 65–99)
PHOSPHORUS: 10 mg/dL — AB (ref 2.5–4.6)
PHOSPHORUS: 5.5 mg/dL — AB (ref 2.5–4.6)
PHOSPHORUS: 7.5 mg/dL — AB (ref 2.5–4.6)
POTASSIUM: 3.7 mmol/L (ref 3.5–5.1)
POTASSIUM: 3.8 mmol/L (ref 3.5–5.1)
POTASSIUM: 3.9 mmol/L (ref 3.5–5.1)
POTASSIUM: 4.1 mmol/L (ref 3.5–5.1)
POTASSIUM: 5.5 mmol/L — AB (ref 3.5–5.1)
Phosphorus: 8.5 mg/dL — ABNORMAL HIGH (ref 2.5–4.6)
Phosphorus: 7.7 mg/dL — ABNORMAL HIGH (ref 2.5–4.6)
Phosphorus: 6.5 mg/dL — ABNORMAL HIGH (ref 2.5–4.6)
Phosphorus: 6 mg/dL — ABNORMAL HIGH (ref 2.5–4.6)
Potassium: 5 mmol/L (ref 3.5–5.1)
Potassium: 4.3 mmol/L (ref 3.5–5.1)
SODIUM: 137 mmol/L (ref 135–145)
SODIUM: 137 mmol/L (ref 135–145)
SODIUM: 138 mmol/L (ref 135–145)
SODIUM: 138 mmol/L (ref 135–145)
Sodium: 138 mmol/L (ref 135–145)
Sodium: 138 mmol/L (ref 135–145)
Sodium: 139 mmol/L (ref 135–145)

## 2015-11-04 LAB — MAGNESIUM
MAGNESIUM: 2.8 mg/dL — AB (ref 1.7–2.4)
MAGNESIUM: 3.1 mg/dL — AB (ref 1.7–2.4)
Magnesium: 3 mg/dL — ABNORMAL HIGH (ref 1.7–2.4)
Magnesium: 2.5 mg/dL — ABNORMAL HIGH (ref 1.7–2.4)
Magnesium: 2.5 mg/dL — ABNORMAL HIGH (ref 1.7–2.4)

## 2015-11-04 LAB — CBC
HCT: 21.9 % — ABNORMAL LOW (ref 40.0–52.0)
HCT: 21 % — ABNORMAL LOW (ref 40.0–52.0)
HEMATOCRIT: 24.7 % — AB (ref 40.0–52.0)
HEMATOCRIT: 27.7 % — AB (ref 40.0–52.0)
Hemoglobin: 8.7 g/dL — ABNORMAL LOW (ref 13.0–18.0)
Hemoglobin: 9.8 g/dL — ABNORMAL LOW (ref 13.0–18.0)
Hemoglobin: 7.6 g/dL — ABNORMAL LOW (ref 13.0–18.0)
Hemoglobin: 7.2 g/dL — ABNORMAL LOW (ref 13.0–18.0)
MCH: 29.1 pg (ref 26.0–34.0)
MCH: 29.4 pg (ref 26.0–34.0)
MCH: 28.5 pg (ref 26.0–34.0)
MCH: 28.1 pg (ref 26.0–34.0)
MCHC: 35.2 g/dL (ref 32.0–36.0)
MCHC: 35.4 g/dL (ref 32.0–36.0)
MCHC: 34.6 g/dL (ref 32.0–36.0)
MCHC: 34.2 g/dL (ref 32.0–36.0)
MCV: 82.8 fL (ref 80.0–100.0)
MCV: 83 fL (ref 80.0–100.0)
MCV: 82.3 fL (ref 80.0–100.0)
MCV: 82.2 fL (ref 80.0–100.0)
PLATELETS: 103 10*3/uL — AB (ref 150–440)
PLATELETS: 81 10*3/uL — AB (ref 150–440)
PLATELETS: 70 10*3/uL — AB (ref 150–440)
Platelets: 99 10*3/uL — ABNORMAL LOW (ref 150–440)
RBC: 2.98 MIL/uL — ABNORMAL LOW (ref 4.40–5.90)
RBC: 3.33 MIL/uL — ABNORMAL LOW (ref 4.40–5.90)
RBC: 2.67 MIL/uL — AB (ref 4.40–5.90)
RBC: 2.56 MIL/uL — ABNORMAL LOW (ref 4.40–5.90)
RDW: 17 % — AB (ref 11.5–14.5)
RDW: 17.1 % — AB (ref 11.5–14.5)
RDW: 16.9 % — AB (ref 11.5–14.5)
RDW: 17.2 % — AB (ref 11.5–14.5)
WBC: 14.4 10*3/uL — ABNORMAL HIGH (ref 3.8–10.6)
WBC: 17.5 10*3/uL — ABNORMAL HIGH (ref 3.8–10.6)
WBC: 13.7 10*3/uL — ABNORMAL HIGH (ref 3.8–10.6)
WBC: 11.6 10*3/uL — AB (ref 3.8–10.6)

## 2015-11-04 LAB — PREPARE RBC (CROSSMATCH)

## 2015-11-04 LAB — GLUCOSE, CAPILLARY
GLUCOSE-CAPILLARY: 201 mg/dL — AB (ref 65–99)
GLUCOSE-CAPILLARY: 284 mg/dL — AB (ref 65–99)
GLUCOSE-CAPILLARY: 248 mg/dL — AB (ref 65–99)
GLUCOSE-CAPILLARY: 203 mg/dL — AB (ref 65–99)
GLUCOSE-CAPILLARY: 235 mg/dL — AB (ref 65–99)
GLUCOSE-CAPILLARY: 222 mg/dL — AB (ref 65–99)
GLUCOSE-CAPILLARY: 212 mg/dL — AB (ref 65–99)
Glucose-Capillary: 323 mg/dL — ABNORMAL HIGH (ref 65–99)
Glucose-Capillary: 298 mg/dL — ABNORMAL HIGH (ref 65–99)
Glucose-Capillary: 298 mg/dL — ABNORMAL HIGH (ref 65–99)
Glucose-Capillary: 209 mg/dL — ABNORMAL HIGH (ref 65–99)
Glucose-Capillary: 281 mg/dL — ABNORMAL HIGH (ref 65–99)
Glucose-Capillary: 187 mg/dL — ABNORMAL HIGH (ref 65–99)
Glucose-Capillary: 162 mg/dL — ABNORMAL HIGH (ref 65–99)

## 2015-11-04 LAB — PROTIME-INR
INR: 1.41
Prothrombin Time: 17.4 seconds — ABNORMAL HIGH (ref 11.4–15.2)

## 2015-11-04 LAB — TROPONIN I
TROPONIN I: 0.44 ng/mL — AB (ref <0.03)
Troponin I: 0.34 ng/mL (ref <0.03)

## 2015-11-04 LAB — PHOSPHORUS
PHOSPHORUS: 6.5 mg/dL — AB (ref 2.5–4.6)
Phosphorus: 7.6 mg/dL — ABNORMAL HIGH (ref 2.5–4.6)

## 2015-11-04 LAB — VANCOMYCIN, TROUGH: Vancomycin Tr: 10 ug/mL — ABNORMAL LOW (ref 15–20)

## 2015-11-04 MED ORDER — IPRATROPIUM-ALBUTEROL 0.5-2.5 (3) MG/3ML IN SOLN
3.0000 mL | Freq: Four times a day (QID) | RESPIRATORY_TRACT | Status: DC
  Administered 2015-11-04 – 2015-11-05 (×5): 3 mL via RESPIRATORY_TRACT
  Filled 2015-11-04 (×6): qty 3

## 2015-11-04 MED ORDER — VANCOMYCIN HCL IN DEXTROSE 750-5 MG/150ML-% IV SOLN
750.0000 mg | INTRAVENOUS | Status: DC
  Administered 2015-11-04 – 2015-11-05 (×2): 750 mg via INTRAVENOUS
  Filled 2015-11-04 (×2): qty 150

## 2015-11-04 MED ORDER — HYDRALAZINE HCL 20 MG/ML IJ SOLN
20.0000 mg | Freq: Once | INTRAMUSCULAR | Status: AC
  Administered 2015-11-04: 20 mg via INTRAVENOUS

## 2015-11-04 MED ORDER — HYDRALAZINE HCL 20 MG/ML IJ SOLN
20.0000 mg | Freq: Four times a day (QID) | INTRAMUSCULAR | Status: DC
  Administered 2015-11-04 (×2): 20 mg via INTRAVENOUS
  Filled 2015-11-04 (×2): qty 1

## 2015-11-04 MED ORDER — ACETYLCYSTEINE 20 % IN SOLN
3.0000 mL | Freq: Four times a day (QID) | RESPIRATORY_TRACT | Status: DC
  Administered 2015-11-04: 3 mL via RESPIRATORY_TRACT
  Administered 2015-11-04: 20:00:00 via RESPIRATORY_TRACT
  Administered 2015-11-05 (×2): 3 mL via RESPIRATORY_TRACT
  Filled 2015-11-04 (×5): qty 4

## 2015-11-04 MED ORDER — SODIUM CHLORIDE 0.9 % IV SOLN
INTRAVENOUS | Status: DC
  Administered 2015-11-04: 1 [IU]/h via INTRAVENOUS
  Filled 2015-11-04: qty 2.5

## 2015-11-04 NOTE — Progress Notes (Signed)
Rewarming phase begun.  Artic sun set per protocol.  Huntley Dec RN verified with this RN.

## 2015-11-04 NOTE — Consult Note (Signed)
CC:  Cardiac arrest   HPI: Jesse Gilbert is an 25 y.o. male Amish male with a  H/o T1DM, and hypertension presenting with acute blood loss anemia secondary to prolonged GI bleed, asystole cardiac arrest, acute respiratory failure and acute renal failure. High risk for anoxic brain injury given prolonged downtime, initial Hb=3; now improved to 8.7 post-transfusion.   Past Medical History:  Diagnosis Date  . Diabetes mellitus without complication (HCC)    Type I DM diagnosed at age 35  . Hypertension     History reviewed. No pertinent surgical history.  History reviewed. No pertinent family history.  Social History:  reports that he has never smoked. He has never used smokeless tobacco. He reports that he does not drink alcohol or use drugs.  No Known Allergies  Medications: I have reviewed the patient's current medications.  ROS: Unable to obtain due to sedation   Physical Examination: Blood pressure (!) 161/101, pulse 65, temperature (!) 91.9 F (33.3 C), resp. rate 18, height 5\' 6"  (1.676 m), weight 94.7 kg (208 lb 12.4 oz), SpO2 100 %.  Pt is sedated, does not follow commands.  No pupils/corneals.    Laboratory Studies:   Basic Metabolic Panel:  Recent Labs Lab 10/14/2015 1500  11/02/2015 1905 10/06/2015 1909 10/20/2015 2105 10/27/2015 2347 11/04/15 0419 11/04/15 1040 11/04/15 1243  NA 137  < >  --  137 135 138 137 137 139  K 6.8*  < >  --  6.3* 5.8* 5.5* 5.0 4.3 4.1  CL 96*  < >  --  97* 98* 100* 98* 99* 101  CO2 13*  < >  --  15* 16* 18* 20* 20* 22  GLUCOSE 345*  < >  --  367* 345* 341* 315* 296* 298*  BUN 171*  < >  --  161* 157* 147* 133* 120* 120*  CREATININE 12.29*  < >  --  11.50* 11.18* 10.52* 9.16* 8.34* 8.30*  CALCIUM 7.8*  < >  --  7.5* 7.4* 7.4* 7.8* 8.1* 7.7*  MG 3.3*  --   --  3.2*  --  3.1* 3.0* 2.8*  --   PHOS 11.1*  --  10.3*  --   --  10.0* 8.5* 7.7*  7.6* 7.5*  < > = values in this interval not displayed.  Liver Function Tests:  Recent  Labs Lab 10/06/2015 0350 10/24/2015 0840  10/29/2015 1905 11/01/2015 2347 11/04/15 0419 11/04/15 1040 11/04/15 1243  AST 35 51*  --   --   --   --   --   --   ALT 26 26  --   --   --   --   --   --   ALKPHOS 92 85  --   --   --   --   --   --   BILITOT 0.7 1.1  --   --   --   --   --   --   PROT 5.1* 5.0*  --   --   --   --   --   --   ALBUMIN 2.4* 2.4*  < > 2.2* 2.2* 2.3* 2.4* 2.2*  < > = values in this interval not displayed.  Recent Labs Lab 10/26/2015 1909  LIPASE 53*   No results for input(s): AMMONIA in the last 168 hours.  CBC:  Recent Labs Lab 10/21/2015 0350 10/26/2015 0634 10/24/2015 1500 10/14/2015 2106 11/04/15 0419 11/04/15 1040  WBC 13.3* 18.3* 12.5* 12.5*  14.4* 17.5*  NEUTROABS 10.1*  --   --   --   --   --   HGB 3.0* 7.3* 6.0* 5.9* 8.7* 9.8*  HCT 10.2* 21.7* 17.3* 17.2* 24.7* 27.7*  MCV 100.2* 88.2 84.6 84.4 82.8 83.0  PLT 193 173 145* 104* 103* 99*    Cardiac Enzymes:  Recent Labs Lab Nov 12, 2015 0840  11-12-2015 1411 11-12-15 1640 November 12, 2015 1909 2015-11-12 2347 11/04/15 1040  CKTOTAL 874*  --   --   --   --   --   --   TROPONINI  --   < > 0.56* 0.51* 0.50* 0.44* 0.34*  < > = values in this interval not displayed.  BNP: Invalid input(s): POCBNP  CBG:  Recent Labs Lab 11/04/15 0752 11/04/15 1104 11/04/15 1230 11/04/15 1320 11/04/15 1433  GLUCAP 298* 298* 284* 201* 209*    Microbiology: Results for orders placed or performed during the hospital encounter of 12-Nov-2015  Culture, blood (routine x 2)     Status: None (Preliminary result)   Collection Time: 2015-11-12  6:34 AM  Result Value Ref Range Status   Specimen Description BLOOD LEFT THUMB  Final   Special Requests BOTTLES DRAWN AEROBIC AND ANAEROBIC  3CC  Final   Culture NO GROWTH 1 DAY  Final   Report Status PENDING  Incomplete  Culture, blood (routine x 2)     Status: None (Preliminary result)   Collection Time: 2015/11/12  8:40 AM  Result Value Ref Range Status   Specimen Description BLOOD LINE   Final   Special Requests BOTTLES DRAWN AEROBIC AND ANAEROBIC  7CC  Final   Culture NO GROWTH < 24 HOURS  Final   Report Status PENDING  Incomplete  MRSA PCR Screening     Status: None   Collection Time: 2015/11/12 10:24 PM  Result Value Ref Range Status   MRSA by PCR NEGATIVE NEGATIVE Final    Comment:        The GeneXpert MRSA Assay (FDA approved for NASAL specimens only), is one component of a comprehensive MRSA colonization surveillance program. It is not intended to diagnose MRSA infection nor to guide or monitor treatment for MRSA infections.     Coagulation Studies:  Recent Labs  November 12, 2015 0634 2015/11/12 1411 11/12/15 2105 11/04/15 0705  LABPROT 17.9* 18.6* 18.7* 17.4*  INR 1.46 1.54 1.55 1.41    Urinalysis: No results for input(s): COLORURINE, LABSPEC, PHURINE, GLUCOSEU, HGBUR, BILIRUBINUR, KETONESUR, PROTEINUR, UROBILINOGEN, NITRITE, LEUKOCYTESUR in the last 168 hours.  Invalid input(s): APPERANCEUR  Lipid Panel:     Component Value Date/Time   CHOL 56 11/12/2015 0350   TRIG 145 11/12/2015 0840   HDL 18 (L) 11-12-15 0350   CHOLHDL 3.1 Nov 12, 2015 0350   VLDL 21 2015/11/12 0350   LDLCALC 17 2015/11/12 0350    HgbA1C: No results found for: HGBA1C  Urine Drug Screen:  No results found for: LABOPIA, COCAINSCRNUR, LABBENZ, AMPHETMU, THCU, LABBARB  Alcohol Level: No results for input(s): ETH in the last 168 hours.  Other results: EKG: normal EKG, normal sinus rhythm, unchanged from previous tracings.  Imaging: Ct Abdomen Pelvis Wo Contrast  Result Date: 11-12-15 CLINICAL DATA:  Acute blood loss and anemia secondary to prolonged GI bleed. Asystole with prolonged hypoxia. Type 1 diabetes. EXAM: CT ABDOMEN AND PELVIS WITHOUT CONTRAST TECHNIQUE: Multidetector CT imaging of the abdomen and pelvis was performed following the standard protocol without IV contrast. COMPARISON:  Two-view abdomen from the same day. FINDINGS: Lower chest: Left greater than right  pleural effusions are present. Left greater than right lower lobe airspace disease is present as well. There is increase consolidation on the left. The heart is mildly enlarged. Anemia is noted. No significant pericardial effusion is present. Hepatobiliary: No focal hepatic lesions are present. The common bile duct is within normal limits. Inflammatory changes surround the gallbladder. Pancreas: Inflammatory changes present at the head and proximal body of the pancreas no discrete mass or cyst is present. Spleen: The spleen is small.  No focal lesions are evident. Adrenals/Urinary Tract: Adrenal glands are within normal limits. The kidneys and ureters are unremarkable. There is no obstruction or stone. A Foley catheter is present within the urinary bladder. Stomach/Bowel: NG tube is in place. The stomach and duodenum demonstrate no focal lesions. There is diffuse mesenteric edema. Fluid levels are present within nondilated loops of bowel. At least 1 locule of free air is present in the abdomen on image 37 of series 3. Other small loculated are suspected in the right upper quadrant as well. Vascular/Lymphatic: Bilateral femoral lines are in place. Marked hypoattenuation in the aorta is compatible with severe anemia. No focal vascular lesions are present. There is no significant adenopathy. Multiple small nodes are present. Reproductive: Within normal limits. Other: Extensive high-density abdominal ascites are present. This is compatible with blood products. Musculoskeletal: Bone windows demonstrate no focal lytic or blastic lesions. IMPRESSION: 1. Diffuse hemoperitoneum. 2. Bronchials of free air compatible with bowel perforation hand pneumoperitoneum. 3. Marked anemia. 4. Poor definition of pancreatic head and proximal pancreas may reflect inflammatory change. 5. Large bilateral pleural effusions and associated airspace disease, left greater than right. These results were called by telephone at the time of  interpretation on 10/13/2015 at 12:14 pm to Dr. Nicholos Johns, who verbally acknowledged these results. Electronically Signed   By: Marin Roberts M.D.   On: 10/06/2015 12:16  Dg Abd 1 View  Result Date: 11/02/2015 CLINICAL DATA:  Evaluate NG tube placement. EXAM: ABDOMEN - 1 VIEW COMPARISON:  None. FINDINGS: The OG tube terminates in the stomach. No other acute abnormalities. IMPRESSION: Appropriate placement of OG tube. Electronically Signed   By: Gerome Sam III M.D   On: 10/14/2015 09:48  Ct Head Wo Contrast  Result Date: 10/20/2015 CLINICAL DATA:  25 year old Amish male with acute blood loss. Hemoglobin of 3. Hypertension. EXAM: CT HEAD WITHOUT CONTRAST TECHNIQUE: Contiguous axial images were obtained from the base of the skull through the vertex without intravenous contrast. COMPARISON:  None. FINDINGS: Scattered ethmoid opacification. Paranasal sinuses, mastoid air cells, middle ears, and bones are otherwise within normal limits. Scalp edema identified. Extracranial soft tissues are otherwise within normal limits. No subdural, epidural, or subarachnoid hemorrhage. Low density in the dural venous sinuses consistent with a low hemoglobin. No midline shift. Ventricles and sulci are unremarkable. The cerebellum and brainstem are normal. Basal cisterns are not yet effaced. Low attenuation is seen in the basal ganglia and both the right/left thalamus. Low attenuation is seen in the posterior white matter with involvement of overlying cortex. A similar finding but less prominent is seen anteriorly in the frontal lobes. No midline shift. IMPRESSION: 1. The findings are consistent with the ischemia/infarct in the basal ganglia and both the right and left thalamus. These findings are consistent with anoxic injury. 2. The low-attenuation seen posteriorly greater than anteriorly in the white matter and cortex is suggestive of posterior reversible encephalopathy. Given the anterior involvement, watershed  infarcts are also possible. No sulcal or basilar cistern effacement at this time.  No intracranial hemorrhage. Findings called to Dr. Nicholos Johns Electronically Signed   By: Gerome Sam III M.D   On: 10/10/2015 12:17  Dg Chest Port 1 View  Result Date: 11/04/2015 CLINICAL DATA:  25 year old male with shortness of breath and acute respiratory distress EXAM: PORTABLE CHEST 1 VIEW COMPARISON:  Chest radiograph dated 10/25/2015 FINDINGS: Endotracheal tube is in stable position. There has been interval straightening of the left subclavian central venous line with tip now overlying the central SVC. Enteric tube extends to the left hemi abdomen with tip beyond the inferior margin of the image. There is diffuse hazy density throughout the left lung, new from prior study which may represent atelectatic changes, aspiration, or less likely ARDS. Clinical correlation is recommended. Right infrahilar hazy density, new from prior study may represent atelectasis. No pneumothorax. Stable mildly enlarged cardiac silhouette. No acute osseous pathology. IMPRESSION: Diffuse hazy opacity throughout the left lung, new from prior study may represent ARDS, aspiration, alveolar edema. Clinical correlation is recommended. No pneumothorax. Interval straightening of the tip of the left subclavian central venous line. Stable positioning of the ETT Electronically Signed   By: Elgie Collard M.D.   On: 11/04/2015 05:59  Dg Chest Port 1 View  Result Date: 10/15/2015 CLINICAL DATA:  Cardiac arrest EXAM: PORTABLE CHEST 1 VIEW COMPARISON:  November 03, 2015 FINDINGS: A left central line has been placed. The distal tip is coiled back on itself. Recommend repositioning. Transcutaneous pacers remain. An ET tube is in good position. The NG tube terminates below today's film. No pneumothorax. No overt edema. No pulmonary nodules or masses. Opacity is not excluded in the left retrocardiac region on this study. IMPRESSION: 1. The distal tip of the  left subclavian line coils back on itself. Recommend repositioning. Other support apparatus in good position within visualize limits. 2. No pneumothorax after line placement. 3. Transcutaneous pacers obscure the left base but opacity, possibly atelectasis, is not excluded in the retrocardiac region. Electronically Signed   By: Gerome Sam III M.D   On: 10/19/2015 08:47  Dg Chest Port 1 View  Result Date: 10/24/2015 CLINICAL DATA:  25 year old male with cardiac arrest. EXAM: PORTABLE CHEST 1 VIEW COMPARISON:  None. FINDINGS: Endotracheal tube is noted with tip in the right mainstem bronchus. Recommend retraction by approximately 5 cm. Enteric tube courses to the left abdomen with tip beyond the inferior margin of the image. Patchy area of airspace density in the right upper lobe may represent atelectasis versus pneumonia. Small loculated right upper lobe pleural effusion may be present. There is no pneumothorax. Top-normal cardiac silhouette. No acute osseous pathology. IMPRESSION: Endotracheal tube in the right mainstem bronchus. Recommend retraction by approximately 5 cm. Right upper lobe atelectasis versus infiltrate as well as small right pleural effusion, possibly loculated. These results were called by telephone at the time of interpretation on 10/09/2015 at 4:42 am to Dr. Bayard Males , who verbally acknowledged these results. Electronically Signed   By: Elgie Collard M.D.   On: 10/07/2015 04:42    Assessment/Plan:  25 y.o. male Amish male with a  H/o T1DM, and hypertension presenting with acute blood loss anemia secondary to prolonged GI bleed, asystole cardiac arrest, acute respiratory failure and acute renal failure.   Pt is now being cooled at 33.  CTH shows signs of early anoxic injury which was explained to the family Prognosis is likely very poor but can't state for sure at this time as can't obtain full exam at this time Code status  discussed with family and they want to continue  full code status at this time Repeat CTH/MRI after rewarmed.   Routine EEG in AM.  Will con't to follow  Nyeisha Goodall  11/04/2015, 3:01 PM

## 2015-11-04 NOTE — Progress Notes (Signed)
Spoke with Barbara Cower CDS.  Reports Pt will be ruled out for Donating.  Please call CDS for Cardiac Time of death.

## 2015-11-04 NOTE — Progress Notes (Signed)
11/04/15 0200  Clinical Encounter Type  Visited With Family;Patient  Visit Type Initial;Critical Care  Referral From Nurse  Consult/Referral To Chaplain  Spiritual Encounters  Spiritual Needs Emotional  Stress Factors  Patient Stress Factors Loss of control  Family Stress Factors Loss;Major life changes  Met w/family and provi ded grief support. Guided father of patient through life review and self-care. Will follow up with family after they have rested. Chap. Cassundra Mckeever G. [place]

## 2015-11-04 NOTE — Progress Notes (Signed)
MEDICATION RELATED CONSULT NOTE - INITIAL   Pharmacy Consult for Medication review for CRRT Indication: Dosage adjustment for CRRT  No Known Allergies  Patient Measurements: Height: 5\' 6"  (167.6 cm) Weight: 208 lb 12.4 oz (94.7 kg) IBW/kg (Calculated) : 63.8   Vital Signs: Temp: 91.9 F (33.3 C) (07/30 0500) BP: 148/91 (07/30 0500) Pulse Rate: 65 (07/30 0500) Intake/Output from previous day: 07/29 0701 - 07/30 0700 In: 4433.2 [I.V.:3763.2; Blood:620; IV Piggyback:50] Out: 250 [Urine:250] Intake/Output from this shift: Total I/O In: 4433.2 [I.V.:3763.2; Blood:620; IV Piggyback:50] Out: 100 [Urine:100]  Labs:  Recent Labs  2015/11/07 0350 07-Nov-2015 0634 November 07, 2015 0840  November 07, 2015 1411 11-07-15 1500  11/07/2015 1905 11/07/15 1909 11-07-2015 2105 07-Nov-2015 2106 2015-11-07 2347 11/04/15 0419  WBC 13.3* 18.3*  --   --   --  12.5*  --   --   --   --  12.5*  --  14.4*  HGB 3.0* 7.3*  --   --   --  6.0*  --   --   --   --  5.9*  --  8.7*  HCT 10.2* 21.7*  --   --   --  17.3*  --   --   --   --  17.2*  --  24.7*  PLT 193 173  --   --   --  145*  --   --   --   --  104*  --  103*  APTT  --  33  --   --  36  --   --   --   --  39*  --   --   --   CREATININE 12.93*  --  12.43*  < > 12.35* 12.29*  < >  --  11.50* 11.18*  --  10.52* 9.16*  MG  --   --  3.6*  < >  --  3.3*  --   --  3.2*  --   --  3.1* 3.0*  PHOS  --   --  11.8*  < >  --  11.1*  --  10.3*  --   --   --  10.0* 8.5*  ALBUMIN 2.4*  --  2.4*  < >  --  2.3*  --  2.2*  --   --   --  2.2* 2.3*  PROT 5.1*  --  5.0*  --   --   --   --   --   --   --   --   --   --   AST 35  --  51*  --   --   --   --   --   --   --   --   --   --   ALT 26  --  26  --   --   --   --   --   --   --   --   --   --   ALKPHOS 92  --  85  --   --   --   --   --   --   --   --   --   --   BILITOT 0.7  --  1.1  --   --   --   --   --   --   --   --   --   --   < > = values in this interval not displayed. Estimated Creatinine Clearance: 13.2  mL/min (by C-G  formula based on SCr of 9.16 mg/dL).   Assessment: 25 yo male starting CRRT Per RN at 1545, starting CRRT now  Plan:  1. Change famotidine to 20 mg IV q48h 2. Change from Zosyn 3.375 IV q12h to q8h EI 3. Pt received vancomycin 1 g IV x1 this AM, CRRT dosing suggests vancomycin 750 mg IV q24h but pt being cooled. Will continue with current orders for vanc random level in the AM to see where vanc level is before continuing to dose.   7/30 AM vanc level 10. Changing to 750 mg q 24 hours. Will check level before 3rd dose.  Leshawn Houseworth S 11/04/2015,6:42 AM

## 2015-11-04 NOTE — Progress Notes (Signed)
Switched CRRT fluid to 4K.

## 2015-11-04 NOTE — Progress Notes (Signed)
[facility]  ROUNDING NOTE   Subjective:  Patient remains critically ill at this point in time. Next line he is on hypothermia protocol. 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 Patient does have some very mild urine output. Metabolic acidosis in part has improved. Hyperkalemia has also improved and potassium is currently down to 5.0. Hemoglobin up to 8.7 after transfusion. CT scan abdomen and pelvis also revealed hemoglobin pneumoperitoneum however patient not a surgical candidate at this time. Further neurology input pending.   Objective:  Vital signs in last 24 hours:  Temp:  [89.8 F (32.1 C)-94.6 F (34.8 C)] 91.9 F (33.3 C) (07/30 0830) Pulse Rate:  [62-85] 65 (07/30 0830) Resp:  [13-28] 18 (07/30 0830) BP: (127-182)/(59-104) 161/101 (07/30 0830) SpO2:  [89 %-100 %] 100 % (07/30 0830) Arterial Line BP: (115-197)/(56-106) 191/105 (07/30 0830) FiO2 (%):  [40 %-80 %] 40 % (07/30 0825) Weight:  [94.7 kg (208 lb 12.4 oz)] 94.7 kg (208 lb 12.4 oz) (07/30 0352)  Weight change: 24.7 kg (54 lb 7.3 oz) Filed Weights   10/15/2015 0330 11/04/15 0352  Weight: 70 kg (154 lb 5.2 oz) 94.7 kg (208 lb 12.4 oz)    Intake/Output: I/O last 3 completed shifts: In: 5913.2 [I.V.:3763.2; Blood:2100; IV Piggyback:50] Out: 250 [Urine:250]   Intake/Output this shift:  Total I/O In: 455.4 [I.V.:455.4] Out: 0   Physical Exam: General: Critically ill appearing  Head: Swollen face  Eyes: Eyelid swelling noted  Neck: Swelling in nect noted  Lungs:  Rales bilateral, vent assisted  Heart: S1S2 no rubs  Abdomen:  Distended, difficult to hear BS  Extremities: Gross anasarca  Neurologic: Currently sedated on propofol and fentanyl  Skin: Areas of hyperpigmentation b/l LE's  Access: R IJ temp HD catheter    Basic Metabolic Panel:  Recent Labs Lab 10/16/2015 1216 10/25/2015 1218  10/11/2015 1500 10/29/2015 1640 10/23/2015 1905 10/18/2015 1909  10/10/2015 2105 10/30/2015 2347 11/04/15 0419  NA 136  --   < > 137 136  --  137 135 138 137  K 6.8*  --   < > 6.8* 6.6*  --  6.3* 5.8* 5.5* 5.0  CL 99*  --   < > 96* 99*  --  97* 98* 100* 98*  CO2 14*  --   < > 13* 15*  --  15* 16* 18* 20*  GLUCOSE 311*  --   < > 345* 358*  --  367* 345* 341* 315*  BUN 171*  --   < > 171* 173*  --  161* 157* 147* 133*  CREATININE 12.73*  --   < > 12.29* 12.62*  --  11.50* 11.18* 10.52* 9.16*  CALCIUM 7.7*  --   < > 7.8* 7.4*  --  7.5* 7.4* 7.4* 7.8*  MG  --  3.5*  --  3.3*  --   --  3.2*  --  3.1* 3.0*  PHOS 12.0*  --   --  11.1*  --  10.3*  --   --  10.0* 8.5*  < > = values in this interval not displayed.  Liver Function Tests:  Recent Labs Lab 10/23/2015 0350 10/24/2015 0840 10/14/2015 1216 10/28/2015 1500 10/16/2015 1905 10/16/2015 2347 11/04/15 0419  AST 35 51*  --   --   --   --   --   ALT 26 26  --   --   --   --   --   ALKPHOS 92 85  --   --   --   --   --  BILITOT 0.7 1.1  --   --   --   --   --   PROT 5.1* 5.0*  --   --   --   --   --   ALBUMIN 2.4* 2.4* 2.5* 2.3* 2.2* 2.2* 2.3*    Recent Labs Lab 10/27/2015 1909  LIPASE 53*   No results for input(s): AMMONIA in the last 168 hours.  CBC:  Recent Labs Lab 10/13/2015 0350 10/21/2015 0634 10/20/2015 1500 10/09/2015 2106 11/04/15 0419  WBC 13.3* 18.3* 12.5* 12.5* 14.4*  NEUTROABS 10.1*  --   --   --   --   HGB 3.0* 7.3* 6.0* 5.9* 8.7*  HCT 10.2* 21.7* 17.3* 17.2* 24.7*  MCV 100.2* 88.2 84.6 84.4 82.8  PLT 193 173 145* 104* 103*    Cardiac Enzymes:  Recent Labs Lab 10/22/2015 0840 10/20/2015 1216 11/04/2015 1411 10/11/2015 1640 10/19/2015 1909 10/15/2015 2347  CKTOTAL 874*  --   --   --   --   --   TROPONINI  --  0.47* 0.56* 0.51* 0.50* 0.44*    BNP: Invalid input(s): POCBNP  CBG:  Recent Labs Lab 10/10/2015 1839 10/12/2015 2355 11/04/15 0350 11/04/15 0752 11/04/15 1104  GLUCAP 387* 360* 323* 298* 298*    Microbiology: Results for orders placed or performed during the hospital  encounter of 10/15/2015  Culture, blood (routine x 2)     Status: None (Preliminary result)   Collection Time: 10/22/2015  6:34 AM  Result Value Ref Range Status   Specimen Description BLOOD LEFT THUMB  Final   Special Requests BOTTLES DRAWN AEROBIC AND ANAEROBIC  3CC  Final   Culture NO GROWTH 1 DAY  Final   Report Status PENDING  Incomplete  Culture, blood (routine x 2)     Status: None (Preliminary result)   Collection Time: 10/14/2015  8:40 AM  Result Value Ref Range Status   Specimen Description BLOOD LINE  Final   Special Requests BOTTLES DRAWN AEROBIC AND ANAEROBIC  [place]  Final   Culture NO GROWTH < 24 HOURS  Final   Report Status PENDING  Incomplete  MRSA PCR Screening     Status: None   Collection Time: 11/01/2015 10:24 PM  Result Value Ref Range Status   MRSA by PCR NEGATIVE NEGATIVE Final    Comment:        The GeneXpert MRSA Assay (FDA approved for NASAL specimens only), is one component of a comprehensive MRSA colonization surveillance program. It is not intended to diagnose MRSA infection nor to guide or monitor treatment for MRSA infections.     Coagulation Studies:  Recent Labs  10/13/2015 0634 11/02/2015 1411 11/04/2015 2105 11/04/15 0705  LABPROT 17.9* 18.6* 18.7* 17.4*  INR 1.46 1.54 1.55 1.41    Urinalysis: No results for input(s): COLORURINE, LABSPEC, PHURINE, GLUCOSEU, HGBUR, BILIRUBINUR, KETONESUR, PROTEINUR, UROBILINOGEN, NITRITE, LEUKOCYTESUR in the last 72 hours.  Invalid input(s): APPERANCEUR    Imaging: Ct Abdomen Pelvis Wo Contrast  Result Date: 10/26/2015 CLINICAL DATA:  Acute blood loss and anemia secondary to prolonged GI bleed. Asystole with prolonged hypoxia. Type 1 diabetes. EXAM: CT ABDOMEN AND PELVIS WITHOUT CONTRAST TECHNIQUE: Multidetector CT imaging of the abdomen and pelvis was performed following the standard protocol without IV contrast. COMPARISON:  Two-view abdomen from the same day. FINDINGS: Lower chest: Left greater than right  pleural effusions are present. Left greater than right lower lobe airspace disease is present as well. There is increase consolidation on the left. The heart is  mildly enlarged. Anemia is noted. No significant pericardial effusion is present. Hepatobiliary: No focal hepatic lesions are present. The common bile duct is within normal limits. Inflammatory changes surround the gallbladder. Pancreas: Inflammatory changes present at the head and proximal body of the pancreas no discrete mass or cyst is present. Spleen: The spleen is small.  No focal lesions are evident. Adrenals/Urinary Tract: Adrenal glands are within normal limits. The kidneys and ureters are unremarkable. There is no obstruction or stone. A Foley catheter is present within the urinary bladder. Stomach/Bowel: NG tube is in place. The stomach and duodenum demonstrate no focal lesions. There is diffuse mesenteric edema. Fluid levels are present within nondilated loops of bowel. At least 1 locule of free air is present in the abdomen on image 37 of series 3. Other small loculated are suspected in the right upper quadrant as well. Vascular/Lymphatic: Bilateral femoral lines are in place. Marked hypoattenuation in the aorta is compatible with severe anemia. No focal vascular lesions are present. There is no significant adenopathy. Multiple small nodes are present. Reproductive: Within normal limits. Other: Extensive high-density abdominal ascites are present. This is compatible with blood products. Musculoskeletal: Bone windows demonstrate no focal lytic or blastic lesions. IMPRESSION: 1. Diffuse hemoperitoneum. 2. Bronchials of free air compatible with bowel perforation hand pneumoperitoneum. 3. Marked anemia. 4. Poor definition of pancreatic head and proximal pancreas may reflect inflammatory change. 5. Large bilateral pleural effusions and associated airspace disease, left greater than right. These results were called by telephone at the time of  interpretation on 10/20/2015 at 12:14 pm to Dr. Ashby Dawes, who verbally acknowledged these results. Electronically Signed   By: San Morelle M.D.   On: 10/18/2015 12:16  Dg Abd 1 View  Result Date: 10/14/2015 CLINICAL DATA:  Evaluate NG tube placement. EXAM: ABDOMEN - 1 VIEW COMPARISON:  None. FINDINGS: The OG tube terminates in the stomach. No other acute abnormalities. IMPRESSION: Appropriate placement of OG tube. Electronically Signed   By: Dorise Bullion III M.D   On: 10/22/2015 09:48  Ct Head Wo Contrast  Result Date: 11/02/2015 CLINICAL DATA:  25 year old Amish male with acute blood loss. Hemoglobin of 3. Hypertension. EXAM: CT HEAD WITHOUT CONTRAST TECHNIQUE: Contiguous axial images were obtained from the base of the skull through the vertex without intravenous contrast. COMPARISON:  None. FINDINGS: Scattered ethmoid opacification. Paranasal sinuses, mastoid air cells, middle ears, and bones are otherwise within normal limits. Scalp edema identified. Extracranial soft tissues are otherwise within normal limits. No subdural, epidural, or subarachnoid hemorrhage. Low density in the dural venous sinuses consistent with a low hemoglobin. No midline shift. Ventricles and sulci are unremarkable. The cerebellum and brainstem are normal. Basal cisterns are not yet effaced. Low attenuation is seen in the basal ganglia and both the right/left thalamus. Low attenuation is seen in the posterior white matter with involvement of overlying cortex. A similar finding but less prominent is seen anteriorly in the frontal lobes. No midline shift. IMPRESSION: 1. The findings are consistent with the ischemia/infarct in the basal ganglia and both the right and left thalamus. These findings are consistent with anoxic injury. 2. The low-attenuation seen posteriorly greater than anteriorly in the white matter and cortex is suggestive of posterior reversible encephalopathy. Given the anterior involvement, watershed  infarcts are also possible. No sulcal or basilar cistern effacement at this time. No intracranial hemorrhage. Findings called to Dr. Ashby Dawes Electronically Signed   By: Dorise Bullion III M.D   On: 11/01/2015 12:17  Dg Chest  Port 1 View  Result Date: 11/04/2015 CLINICAL DATA:  25 year old male with shortness of breath and acute respiratory distress EXAM: PORTABLE CHEST 1 VIEW COMPARISON:  Chest radiograph dated 10/25/2015 FINDINGS: Endotracheal tube is in stable position. There has been interval straightening of the left subclavian central venous line with tip now overlying the central SVC. Enteric tube extends to the left hemi abdomen with tip beyond the inferior margin of the image. There is diffuse hazy density throughout the left lung, new from prior study which may represent atelectatic changes, aspiration, or less likely ARDS. Clinical correlation is recommended. Right infrahilar hazy density, new from prior study may represent atelectasis. No pneumothorax. Stable mildly enlarged cardiac silhouette. No acute osseous pathology. IMPRESSION: Diffuse hazy opacity throughout the left lung, new from prior study may represent ARDS, aspiration, alveolar edema. Clinical correlation is recommended. No pneumothorax. Interval straightening of the tip of the left subclavian central venous line. Stable positioning of the ETT Electronically Signed   By: Anner Crete M.D.   On: 11/04/2015 05:59  Dg Chest Port 1 View  Result Date: 10/08/2015 CLINICAL DATA:  Cardiac arrest EXAM: PORTABLE CHEST 1 VIEW COMPARISON:  November 03, 2015 FINDINGS: A left central line has been placed. The distal tip is coiled back on itself. Recommend repositioning. Transcutaneous pacers remain. An ET tube is in good position. The NG tube terminates below today's film. No pneumothorax. No overt edema. No pulmonary nodules or masses. Opacity is not excluded in the left retrocardiac region on this study. IMPRESSION: 1. The distal tip of the  left subclavian line coils back on itself. Recommend repositioning. Other support apparatus in good position within visualize limits. 2. No pneumothorax after line placement. 3. Transcutaneous pacers obscure the left base but opacity, possibly atelectasis, is not excluded in the retrocardiac region. Electronically Signed   By: Dorise Bullion III M.D   On: 10/23/2015 08:47  Dg Chest Port 1 View  Result Date: 10/15/2015 CLINICAL DATA:  25 year old male with cardiac arrest. EXAM: PORTABLE CHEST 1 VIEW COMPARISON:  None. FINDINGS: Endotracheal tube is noted with tip in the right mainstem bronchus. Recommend retraction by approximately 5 cm. Enteric tube courses to the left abdomen with tip beyond the inferior margin of the image. Patchy area of airspace density in the right upper lobe may represent atelectasis versus pneumonia. Small loculated right upper lobe pleural effusion may be present. There is no pneumothorax. Top-normal cardiac silhouette. No acute osseous pathology. IMPRESSION: Endotracheal tube in the right mainstem bronchus. Recommend retraction by approximately 5 cm. Right upper lobe atelectasis versus infiltrate as well as small right pleural effusion, possibly loculated. These results were called by telephone at the time of interpretation on 10/18/2015 at 4:42 am to Dr. Marjean Donna , who verbally acknowledged these results. Electronically Signed   By: Anner Crete M.D.   On: 10/20/2015 04:42    Medications:   . cisatracurium (NIMBEX) infusion Stopped (10/23/2015 1512)  . fentaNYL infusion INTRAVENOUS 100 mcg/hr (11/04/15 0800)  . insulin (NOVOLIN-R) infusion 1 Units/hr (11/04/15 1106)  . niCARDipine Stopped (10/10/2015 1014)  . norepinephrine Stopped (10/14/2015 1838)  . octreotide  (SANDOSTATIN)    IV infusion 50 mcg/hr (11/04/15 0800)  . pantoprozole (PROTONIX) infusion 8 mg/hr (11/04/15 0800)  . propofol (DIPRIVAN) infusion 30 mcg/kg/min (10/06/2015 0933)  . propofol (DIPRIVAN)  infusion 40 mcg/kg/min (11/04/15 0800)  . pureflow 1,500 mL/hr at 11/04/15 0126  .  sodium bicarbonate  infusion 1000 mL 100 mL/hr at 11/04/15 1021   .  sodium chloride  2,000 mL Intravenous Once  . acetylcysteine  3 mL Nebulization Q6H  . antiseptic oral rinse  7 mL Mouth Rinse 10 times per day  . artificial tears  1 application Both Eyes N5A  . aspirin  300 mg Rectal NOW  . chlorhexidine gluconate (SAGE KIT)  15 mL Mouth Rinse BID  . cisatracurium  0.1 mg/kg Intravenous Once  . [START ON 11/25/15] famotidine (PEPCID) IV  20 mg Intravenous Q48H  . hydrALAZINE  20 mg Intravenous Q6H  . ipratropium-albuterol  3 mL Nebulization Q6H  . [START ON 11/06/2015] pantoprazole  40 mg Intravenous Q12H  . piperacillin-tazobactam (ZOSYN)  IV  3.375 g Intravenous Q8H  . sodium bicarbonate  100 mEq Intravenous Once  . vancomycin  750 mg Intravenous Q24H   sodium chloride, acetaminophen, bisacodyl, cisatracurium **AND** cisatracurium (NIMBEX) infusion **AND** cisatracurium, fentaNYL, heparin, hydrALAZINE, midazolam, midazolam, ondansetron (ZOFRAN) IV, sennosides  Assessment/ Plan:  25 y.o. Amish male with a PMHx of Long-standing diabetes mellitus type 1 since age 69 and hypertension, who was admitted to [facility] on 10/15/2015 for evaluation of prolonged cardiac arrest.  1.  Acute renal failure vs ESRD.  Patient has long standing history of diabetes mellitus type I with fluctuating blood sugars.  Also has had anasarca for a prolonged period of time suggesting renal insufficiency long standing.   -  Patient had only a very mild bit of urine production. BUN and creatinine have come down. Yesterday we were providing very low efficiency dialysis as we're unclear as to how long he's had renal insufficiency with suspected that there was some chronicity of his renal failure. We will increase dialysate flow rate to 2.5 L per hour.  We will decrease bicarbonate drip to 75 cc per hour. In addition I will add ultrafiltration  of 150 cc per hour as blood pressure is elevated at the moment.  2. Hyperkalemia. Serum potassium has come down now to 5.0. Continue 2 potassium bath and switch over to a 4K bath as potassium comes lower.  3. Anemia of chronic kidney disease/acute GI bleed. Hemoglobin 3 upon presentation. He has heme an pneumoperitoneum with ruptured bowel. Currently not a surgical candidate. Hemoglobin has been increased with blood product administration.  4. Prolonged cardiac arrest/acute respiratory failure. Down time was greater than 1 hour. It appears that there are some signs of anoxic brain injury on CT scan. He is currently on hypothermia protocol and urology input is pending.  5. Overall prognosis remains quite guarded.   LOS: 1 Jesse Gilbert 7/30/201711:08 AM

## 2015-11-04 NOTE — Progress Notes (Signed)
PULMONARY / CRITICAL CARE MEDICINE   Name: Marquel Angola Whipkey MRN: [id number] DOB: 07/29/1990    ADMISSION DATE:  10/18/2015     DISCUSSION: 25 y/o Amish male with a  H/o T1DM, and hypertension presenting with acute blood loss anemia secondary to prolonged GI bleed, asystole cardiac arrest, acute respiratory failure and acute renal failure. High risk for anoxic brain injury given prolonged downtime, initial Hb=3; now improved to 8.7 post-transfusion. New left lung atelectasis and worsening lung aeration and anasarca, patient has remained unresponsive, prognosis appears poor.  ASSESSMENT / PLAN:  PULMONARY A: Acute hypoxic respiratory failure secondary to cardiac arrest Right upper lobe infiltrate New Left lung atelectasis CXR images reviewed 7/30-- new left lung atelectasis likely due to mucus plug.   P:   -Continue Full vent support -We'll start the patient on Mucomyst and scheduled bronchodilators for mucous plugging, in addition, the patient will be started on percussion therapy via the bed. -VAP protocol -Daily ABG -Sputum culture -ABX as above -Weaning trials when appropriate -Mucomyst nebulizer -Duoneb q6h -Repeat CXR at 1500  CARDIOVASCULAR A:  Asystole cardiac arrest Hypertensive emergency-did not tolerate cardene infusion P:  -Hypothermia protocol  -Hemodynamic monitoring per ICU -Scheduled and prn hydralazine-titrate to keep MAP 65-100 -2-D echo-done 07/28 -Cardiology following; awaiting recs -EKG prn  STUDIES:  2-D echo 07/29-report pending RENAL A:   Acute renal failure-Possible acute ATN secondary to volume depletion superimposed on diabetic nephropathy Hyperkalemia P:   -Nephrology following -CRRT per nephrology -Monitor and correct electrolyte imbalances -IV fluids -Foley and foley care -Monitor Is/Os -Renally dose medications  GASTROINTESTINAL A:   Acute GI bleed-exact etiology unclear. Patient's father denies any NSAID use but patient used  multiple home remedies--question whether home remedies contained aspirin. Also this may be due to uremia; initial H/H 3.0/10.2; now 5.9/17.2 Perforated bowel with pneumoperitonium P:   -Transfuse blood products as needed  -cbc q6 -Trend coags  -Protonix and octreotide infusions -GI consulted-Spoke with Dr. Servando Snare who recommended PPI and octreotide; GI will see on 7/31-no GI service is available over the weekend. -EDG and colonoscopy once stable -Surgery consulted; discussed with the surgical service, will consider surgical options if The patient appears to awaken and prognosis appears to improve.  HEMATOLOGIC A:   Acute blood loss anemia P:  -Transfuse blood products as needed; patient already received 4 units of PRBCs  -Monitor CBC  INFECTIOUS A:   Sepsis of unknown source-GI versus respiratory Right upper lobe infiltrate suggestive of CAP P:   -Empiric antibiotics -F/U Cultures -Trend procalcitonin  ENDOCRINE A:   T1DM P:   -Blood glucose monitoring with SSI coverage Q4H -Insulin infusion will be ordered today  CULTURES: O7/29 Blood x 2 Urine Sputum  ANTIBIOTICS: Vancomycin 07/29> Zosyn 07/29>  NEUROLOGIC A:   Acute encephalopathy-uremic versus hypoxic versus hypovolemia P:   RASS goal: 0 to -1 -STAT CT head  -neurology consulted -Monitor neuro vital signs -Low dose propofol for vent dyssynchrony  SIGNIFICANT EVENTS: 07/29: Cardiac arrest at home  LINES/TUBES: Left femoral A-Line 07/29 Right Vasc cath 07/29> Left subclavian 07/29  ------------------------------------------------------   SUBJECTIVE:  Remains sedated, intubated, hypertensive and currently on hypothermia protocol and CRRT. Hemoglobin still low. Additional blood products given. Left lung atelectasis on CXR  VITAL SIGNS: BP (!) 149/92   Pulse 65   Temp (!) 91.9 F (33.3 C)   Resp 18   Ht 5\' 6"  (1.676 m)   Wt 208 lb 12.4 oz (94.7 kg)   SpO2 100%  BMI 33.70 kg/m    HEMODYNAMICS: CVP:  [9 mmHg-11 mmHg] 10 mmHg  VENTILATOR SETTINGS: Vent Mode: PRVC FiO2 (%):  [50 %-100 %] 50 % Set Rate:  [18 bmp] 18 bmp Vt Set:  [500 mL] 500 mL PEEP:  [5 cmH20] 5 cmH20 Plateau Pressure:  [19 cmH20-21 cmH20] 19 cmH20  INTAKE / OUTPUT: I/O last 3 completed shifts: In: 1480 [Blood:1480] Out: 150 [Urine:150]  PHYSICAL EXAMINATION: General: acutely ill-looking, edematous Neuro: Unresponsive to noxious stimulus, pupils dilated, fixed and unreactive.  HEENT: [place]/AT, oral mucosa with moderate secretions, poor dentition, ETT, trachea midline, no JVD Cardiovascular: RRR, S1/S2, no MRG, anasarca Lungs: CTAB, diminished in the bases  Abdomen: Distended, hypoactive bowel sounds, tympanic to percussion Musculoskeletal:  No visible deformities, no joint swelling Extremities: +2 edema, +2 pulses bilaterally.  Skin: pale, no rash  LABS:  BMET  Recent Labs Lab 10/19/2015 1909 10/27/2015 2105 11/02/2015 2347  NA 137 135 138  K 6.3* 5.8* 5.5*  CL 97* 98* 100*  CO2 15* 16* 18*  BUN 161* 157* 147*  CREATININE 11.50* 11.18* 10.52*  GLUCOSE 367* 345* 341*    Electrolytes  Recent Labs Lab 11/01/2015 1500  11/01/2015 1905 10/29/2015 1909 10/25/2015 2105 10/11/2015 2347  CALCIUM 7.8*  < >  --  7.5* 7.4* 7.4*  MG 3.3*  --   --  3.2*  --  3.1*  PHOS 11.1*  --  10.3*  --   --  10.0*  < > = values in this interval not displayed.  CBC  Recent Labs Lab 11/02/2015 0634 10/12/2015 1500 10/28/2015 2106  WBC 18.3* 12.5* 12.5*  HGB 7.3* 6.0* 5.9*  HCT 21.7* 17.3* 17.2*  PLT 173 145* 104*    Coag's  Recent Labs Lab 11/02/2015 0634 11/04/2015 1411 10/24/2015 2105  APTT 33 36 39*  INR 1.46 1.54 1.55    Sepsis Markers  Recent Labs Lab 10/11/2015 0840  PROCALCITON 1.37    ABG  Recent Labs Lab 10/15/2015 0515 10/08/2015 1305 10/20/2015 2110  PHART 7.00* 7.28* 7.36  PCO2ART 33 27* 31*  PO2ART 189* 69* 153*    Liver Enzymes  Recent Labs Lab 11/01/2015 0350  10/11/2015 0840  10/19/2015 1500 10/26/2015 1905 11/02/2015 2347  AST 35 51*  --   --   --   --   ALT 26 26  --   --   --   --   ALKPHOS 92 85  --   --   --   --   BILITOT 0.7 1.1  --   --   --   --   ALBUMIN 2.4* 2.4*  < > 2.3* 2.2* 2.2*  < > = values in this interval not displayed.  Cardiac Enzymes  Recent Labs Lab 10/15/2015 1640 10/15/2015 1909 10/10/2015 2347  TROPONINI 0.51* 0.50* 0.44*    Glucose  Recent Labs Lab 10/29/2015 0337 10/13/2015 1000 10/25/2015 1234 11/01/2015 1839 11/02/2015 2355 11/04/15 0350  GLUCAP 272* 279* 331* 387* 360* 323*    Imaging Ct Abdomen Pelvis Wo Contrast  Result Date: 10/10/2015 CLINICAL DATA:  Acute blood loss and anemia secondary to prolonged GI bleed. Asystole with prolonged hypoxia. Type 1 diabetes. EXAM: CT ABDOMEN AND PELVIS WITHOUT CONTRAST TECHNIQUE: Multidetector CT imaging of the abdomen and pelvis was performed following the standard protocol without IV contrast. COMPARISON:  Two-view abdomen from the same day. FINDINGS: Lower chest: Left greater than right pleural effusions are present. Left greater than right lower lobe airspace disease is present as well.  There is increase consolidation on the left. The heart is mildly enlarged. Anemia is noted. No significant pericardial effusion is present. Hepatobiliary: No focal hepatic lesions are present. The common bile duct is within normal limits. Inflammatory changes surround the gallbladder. Pancreas: Inflammatory changes present at the head and proximal body of the pancreas no discrete mass or cyst is present. Spleen: The spleen is small.  No focal lesions are evident. Adrenals/Urinary Tract: Adrenal glands are within normal limits. The kidneys and ureters are unremarkable. There is no obstruction or stone. A Foley catheter is present within the urinary bladder. Stomach/Bowel: NG tube is in place. The stomach and duodenum demonstrate no focal lesions. There is diffuse mesenteric edema. Fluid levels are  present within nondilated loops of bowel. At least 1 locule of free air is present in the abdomen on image 37 of series 3. Other small loculated are suspected in the right upper quadrant as well. Vascular/Lymphatic: Bilateral femoral lines are in place. Marked hypoattenuation in the aorta is compatible with severe anemia. No focal vascular lesions are present. There is no significant adenopathy. Multiple small nodes are present. Reproductive: Within normal limits. Other: Extensive high-density abdominal ascites are present. This is compatible with blood products. Musculoskeletal: Bone windows demonstrate no focal lytic or blastic lesions. IMPRESSION: 1. Diffuse hemoperitoneum. 2. Bronchials of free air compatible with bowel perforation hand pneumoperitoneum. 3. Marked anemia. 4. Poor definition of pancreatic head and proximal pancreas may reflect inflammatory change. 5. Large bilateral pleural effusions and associated airspace disease, left greater than right. These results were called by telephone at the time of interpretation on 11/04/2015 at 12:14 pm to Dr. Nicholos Johns, who verbally acknowledged these results. Electronically Signed   By: Marin Roberts M.D.   On: 10/10/2015 12:16  Dg Abd 1 View  Result Date: 10/19/2015 CLINICAL DATA:  Evaluate NG tube placement. EXAM: ABDOMEN - 1 VIEW COMPARISON:  None. FINDINGS: The OG tube terminates in the stomach. No other acute abnormalities. IMPRESSION: Appropriate placement of OG tube. Electronically Signed   By: Gerome Sam III M.D   On: 11/04/2015 09:48  Ct Head Wo Contrast  Result Date: 10/26/2015 CLINICAL DATA:  25 year old Amish male with acute blood loss. Hemoglobin of 3. Hypertension. EXAM: CT HEAD WITHOUT CONTRAST TECHNIQUE: Contiguous axial images were obtained from the base of the skull through the vertex without intravenous contrast. COMPARISON:  None. FINDINGS: Scattered ethmoid opacification. Paranasal sinuses, mastoid air cells, middle ears,  and bones are otherwise within normal limits. Scalp edema identified. Extracranial soft tissues are otherwise within normal limits. No subdural, epidural, or subarachnoid hemorrhage. Low density in the dural venous sinuses consistent with a low hemoglobin. No midline shift. Ventricles and sulci are unremarkable. The cerebellum and brainstem are normal. Basal cisterns are not yet effaced. Low attenuation is seen in the basal ganglia and both the right/left thalamus. Low attenuation is seen in the posterior white matter with involvement of overlying cortex. A similar finding but less prominent is seen anteriorly in the frontal lobes. No midline shift. IMPRESSION: 1. The findings are consistent with the ischemia/infarct in the basal ganglia and both the right and left thalamus. These findings are consistent with anoxic injury. 2. The low-attenuation seen posteriorly greater than anteriorly in the white matter and cortex is suggestive of posterior reversible encephalopathy. Given the anterior involvement, watershed infarcts are also possible. No sulcal or basilar cistern effacement at this time. No intracranial hemorrhage. Findings called to Dr. Nicholos Johns Electronically Signed   By: Gerome Sam  III M.D   On: 05-Nov-2015 12:17  Dg Chest Port 1 View  Result Date: 10/10/2015 CLINICAL DATA:  Cardiac arrest EXAM: PORTABLE CHEST 1 VIEW COMPARISON:  10/15/2015 FINDINGS: A left central line has been placed. The distal tip is coiled back on itself. Recommend repositioning. Transcutaneous pacers remain. An ET tube is in good position. The NG tube terminates below today's film. No pneumothorax. No overt edema. No pulmonary nodules or masses. Opacity is not excluded in the left retrocardiac region on this study. IMPRESSION: 1. The distal tip of the left subclavian line coils back on itself. Recommend repositioning. Other support apparatus in good position within visualize limits. 2. No pneumothorax after line  placement. 3. Transcutaneous pacers obscure the left base but opacity, possibly atelectasis, is not excluded in the retrocardiac region. Electronically Signed   By: Gerome Sam III M.D   On: 10/27/2015 08:47  Dg Chest Port 1 View  Result Date: 10/21/2015 CLINICAL DATA:  25 year old male with cardiac arrest. EXAM: PORTABLE CHEST 1 VIEW COMPARISON:  None. FINDINGS: Endotracheal tube is noted with tip in the right mainstem bronchus. Recommend retraction by approximately 5 cm. Enteric tube courses to the left abdomen with tip beyond the inferior margin of the image. Patchy area of airspace density in the right upper lobe may represent atelectasis versus pneumonia. Small loculated right upper lobe pleural effusion may be present. There is no pneumothorax. Top-normal cardiac silhouette. No acute osseous pathology. IMPRESSION: Endotracheal tube in the right mainstem bronchus. Recommend retraction by approximately 5 cm. Right upper lobe atelectasis versus infiltrate as well as small right pleural effusion, possibly loculated. These results were called by telephone at the time of interpretation on 11/03/2015 at 4:42 am to Dr. Bayard Males , who verbally acknowledged these results. Electronically Signed   By: Elgie Collard M.D.   On: 10/09/2015 04:42     Disposition and family update: Patient's father updated on current treatment plan at bedside.   Patient's prognosis is guarded given prolonged downtime. Further changes in treatment plan pending patient's clinical course and diagnositics  Best Practice: Code Status:  Full. Diet: NPO GI prophylaxis:  PPI. VTE prophylaxis:  SCD's /no pharmacologic VTE prophylaxis due to acute GIB  Magdalene S. [facility] ANP-BC Pulmonary and Critical Care Medicine [facility] Pager [phone] or [phone]  11/04/2015, 3:54 AM   Patient seen and examined with NP, agree with assessment and plan. The patient remains unresponsive. Today, corneal response is  negative, patient has no gag reflex, no pain response, no cough response on deep suctioning, he continues to initiate breaths on the ventilator. He is noted to move his head spontaneously to the right. We'll continue supportive care including the hypothermia protocol, and reevaluate tomorrow as per discussions with the family to see if the patient's mental status is improved. If not improved at that time, I have counseled the family that I would recommend withdrawal of care at that time.  Wells Guiles, M.D.  11/04/2015   Critical Care Attestation.  I have personally obtained a history, examined the patient, evaluated laboratory and imaging results, formulated the assessment and plan and placed orders. The Patient requires high complexity decision making for assessment and support, frequent evaluation and titration of therapies, application of advanced monitoring technologies and extensive interpretation of multiple databases. The patient has critical illness that could lead imminently to failure of 1 or more organ systems and requires the highest level of physician preparedness to intervene.  Critical Care Time devoted to patient  care services described in this note is 60 minutes and is exclusive of time spent in procedures.

## 2015-11-04 NOTE — Progress Notes (Signed)
Neurologic status unchanged. GCS 3, pupils fixed.  Surgery available if needed.

## 2015-11-04 NOTE — Progress Notes (Signed)
Call to [facility] for patient with GCS of 3. Patient possible candidate for organ procurement, will have return call from [facility]. Reference # [id number] Spoke with Orpha Bur

## 2015-11-05 ENCOUNTER — Inpatient Hospital Stay (HOSPITAL_COMMUNITY): Payer: Self-pay

## 2015-11-05 ENCOUNTER — Inpatient Hospital Stay: Payer: Self-pay

## 2015-11-05 DIAGNOSIS — I469 Cardiac arrest, cause unspecified: Secondary | ICD-10-CM

## 2015-11-05 DIAGNOSIS — J9600 Acute respiratory failure, unspecified whether with hypoxia or hypercapnia: Secondary | ICD-10-CM

## 2015-11-05 DIAGNOSIS — G9382 Brain death: Secondary | ICD-10-CM

## 2015-11-05 LAB — COMPREHENSIVE METABOLIC PANEL
ALT: 21 U/L (ref 17–63)
ANION GAP: 11 (ref 5–15)
AST: 29 U/L (ref 15–41)
Albumin: 1.8 g/dL — ABNORMAL LOW (ref 3.5–5.0)
Alkaline Phosphatase: 56 U/L (ref 38–126)
BUN: 77 mg/dL — ABNORMAL HIGH (ref 6–20)
CALCIUM: 7.6 mg/dL — AB (ref 8.9–10.3)
CHLORIDE: 103 mmol/L (ref 101–111)
CO2: 25 mmol/L (ref 22–32)
Creatinine, Ser: 5.4 mg/dL — ABNORMAL HIGH (ref 0.61–1.24)
GFR calc non Af Amer: 13 mL/min — ABNORMAL LOW (ref >60)
GFR, EST AFRICAN AMERICAN: 15 mL/min — AB (ref >60)
Glucose, Bld: 88 mg/dL (ref 65–99)
Potassium: 4.1 mmol/L (ref 3.5–5.1)
SODIUM: 139 mmol/L (ref 135–145)
Total Bilirubin: 0.6 mg/dL (ref 0.3–1.2)
Total Protein: 4.1 g/dL — ABNORMAL LOW (ref 6.5–8.1)

## 2015-11-05 LAB — BLOOD GAS, ARTERIAL
ACID-BASE DEFICIT: 12.8 mmol/L — AB (ref 0.0–2.0)
ACID-BASE EXCESS: 3.4 mmol/L — AB (ref 0.0–3.0)
Acid-Base Excess: 3.1 mmol/L — ABNORMAL HIGH (ref 0.0–3.0)
Allens test (pass/fail): POSITIVE — AB
BICARBONATE: 12.7 meq/L — AB (ref 21.0–28.0)
Bicarbonate: 26.8 mEq/L (ref 21.0–28.0)
Bicarbonate: 29.9 mEq/L — ABNORMAL HIGH (ref 21.0–28.0)
Delivery systems: 8
FIO2: 1
FIO2: 0.5
FIO2: 0.4
MECHANICAL RATE: 18
MECHANICAL RATE: 18
O2 SAT: 98.6 %
O2 Saturation: 90.9 %
O2 Saturation: 96.9 %
PATIENT TEMPERATURE: 36.9
PCO2 ART: 49 mmHg — AB (ref 32.0–48.0)
PCO2 ART: 27 mmHg — AB (ref 32.0–48.0)
PEEP/CPAP: 5 cmH2O
PEEP/CPAP: 5 cmH2O
PEEP: 5 cmH2O
PH ART: 7.32 — AB (ref 7.350–7.450)
PO2 ART: 153 mmHg — AB (ref 83.0–108.0)
Patient temperature: 37
Patient temperature: 37
Patient temperature: 37
RATE: 18 resp/min
VT: 500 mL
VT: 500 mL
VT: 500 mL
pCO2 arterial: 36 mmHg (ref 32.0–48.0)
pCO2 arterial: 58 mmHg — ABNORMAL HIGH (ref 32.0–48.0)
pH, Arterial: 7.28 — ABNORMAL LOW (ref 7.350–7.450)
pH, Arterial: 7.48 — ABNORMAL HIGH (ref 7.350–7.450)
pO2, Arterial: 69 mmHg — ABNORMAL LOW (ref 83.0–108.0)
pO2, Arterial: 83 mmHg (ref 83.0–108.0)
pO2, Arterial: 124 mmHg — ABNORMAL HIGH (ref 83.0–108.0)

## 2015-11-05 LAB — GLUCOSE, CAPILLARY
GLUCOSE-CAPILLARY: 113 mg/dL — AB (ref 65–99)
GLUCOSE-CAPILLARY: 132 mg/dL — AB (ref 65–99)
GLUCOSE-CAPILLARY: 147 mg/dL — AB (ref 65–99)
GLUCOSE-CAPILLARY: 61 mg/dL — AB (ref 65–99)
GLUCOSE-CAPILLARY: 73 mg/dL (ref 65–99)
GLUCOSE-CAPILLARY: 77 mg/dL (ref 65–99)
GLUCOSE-CAPILLARY: 83 mg/dL (ref 65–99)
GLUCOSE-CAPILLARY: 88 mg/dL (ref 65–99)
Glucose-Capillary: 92 mg/dL (ref 65–99)
Glucose-Capillary: 83 mg/dL (ref 65–99)
Glucose-Capillary: 57 mg/dL — ABNORMAL LOW (ref 65–99)

## 2015-11-05 LAB — PROTIME-INR
INR: 1.21
PROTHROMBIN TIME: 15.4 s — AB (ref 11.4–15.2)

## 2015-11-05 LAB — CBC
HCT: 20 % — ABNORMAL LOW (ref 40.0–52.0)
HEMOGLOBIN: 7.1 g/dL — AB (ref 13.0–18.0)
MCH: 29.7 pg (ref 26.0–34.0)
MCHC: 35.6 g/dL (ref 32.0–36.0)
MCV: 83.4 fL (ref 80.0–100.0)
Platelets: 74 10*3/uL — ABNORMAL LOW (ref 150–440)
RBC: 2.39 MIL/uL — AB (ref 4.40–5.90)
RDW: 17.6 % — ABNORMAL HIGH (ref 11.5–14.5)
WBC: 8.9 10*3/uL (ref 3.8–10.6)

## 2015-11-05 LAB — ECHOCARDIOGRAM COMPLETE
HEIGHTINCHES: 66 in
WEIGHTICAEL: 2469.15 [oz_av]

## 2015-11-05 LAB — PHOSPHORUS: Phosphorus: 4.8 mg/dL — ABNORMAL HIGH (ref 2.5–4.6)

## 2015-11-05 LAB — MAGNESIUM: MAGNESIUM: 2.1 mg/dL (ref 1.7–2.4)

## 2015-11-05 MED ORDER — INSULIN ASPART 100 UNIT/ML ~~LOC~~ SOLN: 1.0000 [IU] | SUBCUTANEOUS | Status: DC

## 2015-11-05 MED ORDER — DEXTROSE 50 % IV SOLN
INTRAVENOUS | Status: AC
  Administered 2015-11-05: 50 mL
  Filled 2015-11-05: qty 50

## 2015-11-05 MED ORDER — INSULIN GLARGINE 100 UNIT/ML ~~LOC~~ SOLN: 10.0000 [IU] | SUBCUTANEOUS | Status: DC

## 2015-11-05 MED ORDER — DEXTROSE 50 % IV SOLN: 50.0000 mL | Freq: Once | INTRAVENOUS | Status: DC

## 2015-11-05 MED ORDER — DEXTROSE 50 % IV SOLN
50.0000 mL | Freq: Once | INTRAVENOUS | Status: AC
  Administered 2015-11-05: 50 mL via INTRAVENOUS

## 2015-11-05 MED ORDER — ALTEPLASE 2 MG IJ SOLR
2.0000 mg | Freq: Once | INTRAMUSCULAR | Status: AC
  Administered 2015-11-05: 2 mg
  Filled 2015-11-05: qty 2

## 2015-11-05 MED ORDER — DEXTROSE 50 % IV SOLN
INTRAVENOUS | Status: AC
  Filled 2015-11-05: qty 50

## 2015-11-05 MED ORDER — STERILE WATER FOR INJECTION IJ SOLN
INTRAMUSCULAR | Status: AC
  Administered 2015-11-05: 10:00:00
  Filled 2015-11-05: qty 10

## 2015-11-05 MED ORDER — PIPERACILLIN-TAZOBACTAM 3.375 G IVPB
3.3750 g | Freq: Two times a day (BID) | INTRAVENOUS | Status: DC
  Filled 2015-11-05: qty 50

## 2015-11-05 MED ORDER — INSULIN GLARGINE 100 UNIT/ML ~~LOC~~ SOLN
10.0000 [IU] | SUBCUTANEOUS | Status: DC
  Administered 2015-11-05: 10 [IU] via SUBCUTANEOUS
  Filled 2015-11-05: qty 0.1

## 2015-11-06 LAB — TYPE AND SCREEN
ABO/RH(D): O POS
Antibody Screen: NEGATIVE
UNIT DIVISION: 0
UNIT DIVISION: 0
UNIT DIVISION: 0
UNIT DIVISION: 0
Unit division: 0
Unit division: 0
Unit division: 0
Unit division: 0

## 2015-11-06 LAB — PREPARE RBC (CROSSMATCH)

## 2015-11-06 MED FILL — Medication: Qty: 1 | Status: AC

## 2015-11-06 NOTE — Progress Notes (Signed)
eLink Physician-Brief Progress Note Patient Name: Jesse Gilbert DOB: Sep 12, 1990 MRN: [id number]   Date of Service  10/15/2015  HPI/Events of Note  RN calls re dropping Hb and HCT > from 7.6 to 7.2.  BP 125/60  HR 75 100% on vent  No obvious bleeding seen  eICU Interventions  Hold off on transfusing for now. Rpt Hb and hct at 5am < if < 7 Hb, will transfuse pRBC.         [name] [address] [place] 10/21/2015, 1:45 AM

## 2015-11-06 NOTE — Progress Notes (Signed)
1600  Pt. Was suctioned prior to extubation for a moderate amount of thick tan secretions. Per  Dr. Sung Amabile order he was extubated.

## 2015-11-06 NOTE — Progress Notes (Addendum)
Dr. Sung Amabile made aware of patient passing at 18:20. [facility] notified.

## 2015-11-06 NOTE — Progress Notes (Signed)
Terminally extubated patient per Dr. Sung Amabile and family.  RT and I at bedside.  Mouthcare performed.  Patient looks comfortable.

## 2015-11-06 NOTE — Significant Event (Signed)
I have informed father, mother, family and friends of the confirmation of brain death. We will proceed with extubation  Billy Fischer, MD PCCM service Mobile [phone] Pager [phone] 10/24/2015

## 2015-11-06 NOTE — Procedures (Signed)
Pt. Ws transported to CT on the ventilator and back to CCU without incident.

## 2015-11-06 NOTE — Progress Notes (Signed)
Per Dr. Sung Amabile- orders to withdraw care per family. No PRN medicines ordered.

## 2015-11-06 NOTE — Progress Notes (Signed)
Spoke to Dr. 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  Notified Dr. Wynelle Link of this information and he agreed.  Order to discontinue CRRT.  Waiting for EEG at this time.

## 2015-11-06 NOTE — Progress Notes (Signed)
Pt vitals remain stable. CRRT temporarily stopped due to access clotting. Heparin instilled per protocol. Pt still in rewarming process greater than 13hrs. Notified Dr. Clayborn Bigness. Will continue to monitor

## 2015-11-06 NOTE — Progress Notes (Signed)
[facility]  ROUNDING NOTE   Subjective:   CRRT held since this morning. Nonfunctioning venous port CT with progression of anoxic brain injury.   Not breathing over the vent  Objective:  Vital signs in last 24 hours:  Temp:  [91.9 F (33.3 C)-98.1 F (36.7 C)] 98.1 F (36.7 C) (07/31 0600) Pulse Rate:  [66-84] 84 (07/31 1000) Resp:  [10-35] 21 (07/31 1000) BP: (108-149)/(54-81) 122/62 (07/31 1000) SpO2:  [100 %] 100 % (07/31 1000) Arterial Line BP: (105-153)/(54-89) 120/67 (07/31 1000) FiO2 (%):  [40 %] 40 % (07/31 0805) Weight:  [97.5 kg (214 lb 15.2 oz)] 97.5 kg (214 lb 15.2 oz) (07/31 0500)  Weight change: 2.8 kg (6 lb 2.8 oz) Filed Weights   10/23/2015 0330 11/04/15 0352 12/03/15 0500  Weight: 70 kg (154 lb 5.2 oz) 94.7 kg (208 lb 12.4 oz) 97.5 kg (214 lb 15.2 oz)    Intake/Output: I/O last 3 completed shifts: In: 8772.1 [I.V.:7902.1; Blood:620; IV Piggyback:250] Out: 2364 [Urine:277; Other:2087]   Intake/Output this shift:  No intake/output data recorded.  Physical Exam: General: Critically ill   Head: +ETT  Eyes: Eyelid swelling  Neck: Left subclavian TLC  Lungs:  Rales bilateral, PRVC FiO2 40%  Heart: regular  Abdomen:  Distended  Extremities: +++anasarca  Neurologic: Intubated and sedated  Skin: No lesions  Access: R IJ temp HD catheter 7/29 Dr. Isidore Moos    Basic Metabolic Panel:  Recent Labs Lab 11/04/15 0419 11/04/15 1040 11/04/15 1243 11/04/15 1551 11/04/15 2048 11/04/15 2318 11/04/15 2319 12-03-2015 0454  NA 137 137 139 138 138  --  138 139  K 5.0 4.3 4.1 3.9 3.7  --  3.8 4.1  CL 98* 99* 101 100* 106  --  102 103  CO2 20* 20* 22 22 21*  --  23 25  GLUCOSE 315* 296* 298* 281* 211*  --  170* 88  BUN 133* 120* 120* 105* 97*  --  89* 77*  CREATININE 9.16* 8.34* 8.30* 7.54* 6.80*  --  6.41* 5.40*  CALCIUM 7.8* 8.1* 7.7* 7.6* 7.4*  --  7.6* 7.6*  MG 3.0* 2.8*  --  2.5*  --  2.5*  --  2.1  PHOS 8.5* 7.7*  7.6* 7.5* 6.5*   6.5* 6.0*  --  5.5* 4.8*    Liver Function Tests:  Recent Labs Lab 10/17/2015 0350 10/12/2015 0840  11/04/15 1243 11/04/15 1551 11/04/15 2048 11/04/15 2319 12/03/15 0454  AST 35 51*  --   --   --   --   --  29  ALT 26 26  --   --   --   --   --  21  ALKPHOS 92 85  --   --   --   --   --  56  BILITOT 0.7 1.1  --   --   --   --   --  0.6  PROT 5.1* 5.0*  --   --   --   --   --  4.1*  ALBUMIN 2.4* 2.4*  < > 2.2* 1.9* 1.9* 1.9* 1.8*  < > = values in this interval not displayed.  Recent Labs Lab 10/20/2015 1909  LIPASE 53*   No results for input(s): AMMONIA in the last 168 hours.  CBC:  Recent Labs Lab 10/08/2015 0350  11/04/15 0419 11/04/15 1040 11/04/15 1551 11/04/15 2318 12-03-2015 0454  WBC 13.3*  < > 14.4* 17.5* 13.7* 11.6* 8.9  NEUTROABS 10.1*  --   --   --   --   --   --  HGB 3.0*  < > 8.7* 9.8* 7.6* 7.2* 7.1*  HCT 10.2*  < > 24.7* 27.7* 21.9* 21.0* 20.0*  MCV 100.2*  < > 82.8 83.0 82.3 82.2 83.4  PLT 193  < > 103* 99* 81* 70* 74*  < > = values in this interval not displayed.  Cardiac Enzymes:  Recent Labs Lab 10/28/2015 0840  10/24/2015 1411 10/17/2015 1640 10/19/2015 1909 10/10/2015 2347 11/04/15 1040  CKTOTAL 874*  --   --   --   --   --   --   TROPONINI  --   < > 0.56* 0.51* 0.50* 0.44* 0.34*  < > = values in this interval not displayed.  BNP: Invalid input(s): POCBNP  CBG:  Recent Labs Lab 01-Dec-2015 0300 12/01/2015 0406 12/01/2015 0508 01-Dec-2015 0613 2015-12-01 0731  GLUCAP 83 92 83 77 73    Microbiology: Results for orders placed or performed during the hospital encounter of 10/10/2015  Culture, blood (routine x 2)     Status: None (Preliminary result)   Collection Time: 10/07/2015  6:34 AM  Result Value Ref Range Status   Specimen Description BLOOD LEFT THUMB  Final   Special Requests BOTTLES DRAWN AEROBIC AND ANAEROBIC  3CC  Final   Culture NO GROWTH 1 DAY  Final   Report Status PENDING  Incomplete  Culture, blood (routine x 2)     Status: None  (Preliminary result)   Collection Time: 10/25/2015  8:40 AM  Result Value Ref Range Status   Specimen Description BLOOD LINE  Final   Special Requests BOTTLES DRAWN AEROBIC AND ANAEROBIC  Luke  Final   Culture NO GROWTH < 24 HOURS  Final   Report Status PENDING  Incomplete  MRSA PCR Screening     Status: None   Collection Time: 10/20/2015 10:24 PM  Result Value Ref Range Status   MRSA by PCR NEGATIVE NEGATIVE Final    Comment:        The GeneXpert MRSA Assay (FDA approved for NASAL specimens only), is one component of a comprehensive MRSA colonization surveillance program. It is not intended to diagnose MRSA infection nor to guide or monitor treatment for MRSA infections.     Coagulation Studies:  Recent Labs  10/10/2015 0634 10/16/2015 1411 10/06/2015 2105 11/04/15 0705 01-Dec-2015 0454  LABPROT 17.9* 18.6* 18.7* 17.4* 15.4*  INR 1.46 1.54 1.55 1.41 1.21    Urinalysis: No results for input(s): COLORURINE, LABSPEC, PHURINE, GLUCOSEU, HGBUR, BILIRUBINUR, KETONESUR, PROTEINUR, UROBILINOGEN, NITRITE, LEUKOCYTESUR in the last 72 hours.  Invalid input(s): APPERANCEUR    Imaging: Ct Abdomen Pelvis Wo Contrast  Result Date: 10/07/2015 CLINICAL DATA:  Acute blood loss and anemia secondary to prolonged GI bleed. Asystole with prolonged hypoxia. Type 1 diabetes. EXAM: CT ABDOMEN AND PELVIS WITHOUT CONTRAST TECHNIQUE: Multidetector CT imaging of the abdomen and pelvis was performed following the standard protocol without IV contrast. COMPARISON:  Two-view abdomen from the same day. FINDINGS: Lower chest: Left greater than right pleural effusions are present. Left greater than right lower lobe airspace disease is present as well. There is increase consolidation on the left. The heart is mildly enlarged. Anemia is noted. No significant pericardial effusion is present. Hepatobiliary: No focal hepatic lesions are present. The common bile duct is within normal limits. Inflammatory changes surround  the gallbladder. Pancreas: Inflammatory changes present at the head and proximal body of the pancreas no discrete mass or cyst is present. Spleen: The spleen is small.  No focal lesions are evident. Adrenals/Urinary Tract: Adrenal  glands are within normal limits. The kidneys and ureters are unremarkable. There is no obstruction or stone. A Foley catheter is present within the urinary bladder. Stomach/Bowel: NG tube is in place. The stomach and duodenum demonstrate no focal lesions. There is diffuse mesenteric edema. Fluid levels are present within nondilated loops of bowel. At least 1 locule of free air is present in the abdomen on image 37 of series 3. Other small loculated are suspected in the right upper quadrant as well. Vascular/Lymphatic: Bilateral femoral lines are in place. Marked hypoattenuation in the aorta is compatible with severe anemia. No focal vascular lesions are present. There is no significant adenopathy. Multiple small nodes are present. Reproductive: Within normal limits. Other: Extensive high-density abdominal ascites are present. This is compatible with blood products. Musculoskeletal: Bone windows demonstrate no focal lytic or blastic lesions. IMPRESSION: 1. Diffuse hemoperitoneum. 2. Bronchials of free air compatible with bowel perforation hand pneumoperitoneum. 3. Marked anemia. 4. Poor definition of pancreatic head and proximal pancreas may reflect inflammatory change. 5. Large bilateral pleural effusions and associated airspace disease, left greater than right. These results were called by telephone at the time of interpretation on 10/26/2015 at 12:14 pm to Dr. Ashby Dawes, who verbally acknowledged these results. Electronically Signed   By: San Morelle M.D.   On: 10/31/2015 12:16  Ct Head Wo Contrast  Result Date: 11/17/2015 CLINICAL DATA:  Unresponsive. Type 1 diabetes. Prolonged cardiac arrest. EXAM: CT HEAD WITHOUT CONTRAST TECHNIQUE: Contiguous axial images were obtained  from the base of the skull through the vertex without intravenous contrast. COMPARISON:  Two days ago. FINDINGS: Diffuse cerebral edema with no sulci or cisterns and foramen magnum stenosis. Discrete deep gray nuclei and cortical watershed pattern infarcts. Pseudo subarachnoid sign. No acute hemorrhage or hydrocephalus. Generalized progressive scalp swelling. Intubation with sinus fluid levels. These results were called by telephone at the time of interpretation on Nov 17, 2015 at 10:08 am to Dr. Rosita Fire who verbally acknowledged these results. IMPRESSION: Severe anoxic injury with progressed generalized cerebral edema and diffuse infarct. Electronically Signed   By: Monte Fantasia M.D.   On: 2015-11-17 10:34  Ct Head Wo Contrast  Result Date: 10/19/2015 CLINICAL DATA:  25 year old Amish male with acute blood loss. Hemoglobin of 3. Hypertension. EXAM: CT HEAD WITHOUT CONTRAST TECHNIQUE: Contiguous axial images were obtained from the base of the skull through the vertex without intravenous contrast. COMPARISON:  None. FINDINGS: Scattered ethmoid opacification. Paranasal sinuses, mastoid air cells, middle ears, and bones are otherwise within normal limits. Scalp edema identified. Extracranial soft tissues are otherwise within normal limits. No subdural, epidural, or subarachnoid hemorrhage. Low density in the dural venous sinuses consistent with a low hemoglobin. No midline shift. Ventricles and sulci are unremarkable. The cerebellum and brainstem are normal. Basal cisterns are not yet effaced. Low attenuation is seen in the basal ganglia and both the right/left thalamus. Low attenuation is seen in the posterior white matter with involvement of overlying cortex. A similar finding but less prominent is seen anteriorly in the frontal lobes. No midline shift. IMPRESSION: 1. The findings are consistent with the ischemia/infarct in the basal ganglia and both the right and left thalamus. These findings are consistent with  anoxic injury. 2. The low-attenuation seen posteriorly greater than anteriorly in the white matter and cortex is suggestive of posterior reversible encephalopathy. Given the anterior involvement, watershed infarcts are also possible. No sulcal or basilar cistern effacement at this time. No intracranial hemorrhage. Findings called to Dr. Ashby Dawes Electronically Signed  By: Dorise Bullion III M.D   On: 10/12/2015 12:17  Dg Chest Port 1 View  Result Date: 2015/11/11 CLINICAL DATA:  Acute respiratory failure. EXAM: PORTABLE CHEST 1 VIEW COMPARISON:  11/04/2015 FINDINGS: Endotracheal tube, left central line and NG tube remain in place, unchanged. Bent her aeration of the left lung. Continued airspace disease in the left perihilar region and left lower lobe. Increasing right lower lobe airspace opacity. Cardiomegaly. IMPRESSION: Slight improvement inhalation the left upper lobe with continued airspace disease in the left perihilar region and left lower lobe. Increasing right consolidation. Electronically Signed   By: Rolm Baptise M.D.   On: November 11, 2015 09:13  Dg Chest Port 1 View  Result Date: 11/04/2015 CLINICAL DATA:  25 year old male with shortness of breath and acute respiratory distress EXAM: PORTABLE CHEST 1 VIEW COMPARISON:  Chest radiograph dated 10/25/2015 FINDINGS: Endotracheal tube is in stable position. There has been interval straightening of the left subclavian central venous line with tip now overlying the central SVC. Enteric tube extends to the left hemi abdomen with tip beyond the inferior margin of the image. There is diffuse hazy density throughout the left lung, new from prior study which may represent atelectatic changes, aspiration, or less likely ARDS. Clinical correlation is recommended. Right infrahilar hazy density, new from prior study may represent atelectasis. No pneumothorax. Stable mildly enlarged cardiac silhouette. No acute osseous pathology. IMPRESSION: Diffuse hazy opacity  throughout the left lung, new from prior study may represent ARDS, aspiration, alveolar edema. Clinical correlation is recommended. No pneumothorax. Interval straightening of the tip of the left subclavian central venous line. Stable positioning of the ETT Electronically Signed   By: Anner Crete M.D.   On: 11/04/2015 05:59    Medications:   .  sodium bicarbonate  infusion 1000 mL 75 mL/hr at 11/11/15 0022   . antiseptic oral rinse  7 mL Mouth Rinse 10 times per day  . artificial tears  1 application Both Eyes Y2Q  . chlorhexidine gluconate (SAGE KIT)  15 mL Mouth Rinse BID  . ipratropium-albuterol  3 mL Nebulization Q6H  . [START ON 11/06/2015] pantoprazole  40 mg Intravenous Q12H  . piperacillin-tazobactam (ZOSYN)  IV  3.375 g Intravenous Q8H  . sterile water (preservative free)       sodium chloride, acetaminophen, bisacodyl, ondansetron (ZOFRAN) IV, sennosides  Assessment/ Plan:  25 y.o. white male (Amish) with Long-standing diabetes mellitus type 1 since age 50 and hypertension, who was admitted to [facility] on 10/20/2015 for evaluation of prolonged cardiac arrest.  1.  Acute renal failure vs ESRD with hyperkalemia and metabolic acidosis.  Patient has long standing history of diabetes mellitus type I with fluctuating blood sugars.  Also has had anasarca for a prolonged period of time suggesting renal insufficiency long standing.  No known baseline creatinine  - overall prognosis is very poor. At this time, difficult to restart CRRT. Will discuss further with family.  - Sodium bicarbonate  2. Anemia of chronic kidney disease/acute GI bleed. With hemoperitoneam - octreotide and pantoprazole    LOS: 2 Zeynab Klett 08-06-201711:10 AM

## 2015-11-06 NOTE — Progress Notes (Signed)
Pharmacy Antibiotic Note  Kramer Angola Rachal is a 25 y.o. male admitted on Nov 08, 2015 with sepsis.  Pharmacy has been consulted for vancomycin and Zosyn dosing.  Plan: After discussion with Dr. Sung Amabile, MRSA PCR is negative and will d/c vancomycin.   Patient not currently on CRRT due to nonfunctioning venous port. Will change Zosyn dosing to 3.375 g EI q 12 hours.   Height: 5\' 6"  (167.6 cm) Weight: 214 lb 15.2 oz (97.5 kg) IBW/kg (Calculated) : 63.8  Temp (24hrs), Avg:94.1 F (34.5 C), Min:91.9 F (33.3 C), Max:98.1 F (36.7 C)   Recent Labs Lab 11/04/15 0419 11/04/15 1040 11/04/15 1243 11/04/15 1551 11/04/15 2048 11/04/15 2318 11/04/15 2319 11/02/2015 0454  WBC 14.4* 17.5*  --  13.7*  --  11.6*  --  8.9  CREATININE 9.16* 8.34* 8.30* 7.54* 6.80*  --  6.41* 5.40*  VANCOTROUGH 10*  --   --   --   --   --   --   --     Estimated Creatinine Clearance: 22.7 mL/min (by C-G formula based on SCr of 5.4 mg/dL).    No Known Allergies  Antimicrobials this admission: vancomycin  7/29 >> 7/31 Zosyn 7/29 >>   Dose adjustments this admission: 7/31 Zosyn changed to 3.375 g EI q 12 hours due to CRRT stopped  Microbiology results: 7/29 BCx: NGTD 7/29 UCx: canceled 7/29 Sputum: canceled 7/29 MRSA PCR: negative  7/29 UA: pending 7/29 CXR: RUL atelectasis vs. infiltrate  Thank you for allowing pharmacy to be a part of this patient's care.  Luisa Hart D 10/18/2015 11:50 AM

## 2015-11-06 NOTE — Progress Notes (Signed)
CDS updated about patient's prognosis.  Spoke to [employer], Nurse, adult- He stated that due to patient's bowel perforation, that patient would be eligible for tissue and eye donation.  He stated to contact CDS again when the patient's passes away.

## 2015-11-06 NOTE — Progress Notes (Signed)
Inpatient Diabetes Program Recommendations  AACE/ADA: New Consensus Statement on Inpatient Glycemic Control (2015)  Target Ranges:  Prepandial:   less than 140 mg/dL      Peak postprandial:   less than 180 mg/dL (1-2 hours)      Critically ill patients:  140 - 180 mg/dL   Admit: "25 y/o Amish male with a  H/o T1DM, and hypertension presenting with acute blood loss anemia secondary to prolonged GI bleed, asystole cardiac arrest, acute respiratory failure and acute renal failure. High risk for anoxic brain injury given prolonged downtime, initial Hb=3; now improved to 8.7 post-transfusion. New left lung atelectasis and worsening lung aeration and anasarca, patient has remained unresponsive, prognosis appears poor."  History: Type 1 DM  Home DM Meds: 70/30 Insulin- Unclear on doses       Regular Insulin- Unclear on doses  Current Insulin Orders: None at present      -Note IV Insulin drip stopped at ~6:30 this AM.  No SQ insulin started.  Concern for development of DKA as patient has no SQ insulin orders and has Type 1 DM.  -Spoke with RN about this issue this morning.  RN spoke with Dr. Sung Amabile who stated they do not plan to restart SQ insulin as patient has suffered brain death.   --Will follow patient during hospitalization--  Ambrose Finland RN, MSN, CDE Diabetes Coordinator Inpatient Glycemic Control Team Team Pager: [phone] (8a-5p)

## 2015-11-06 NOTE — Progress Notes (Addendum)
RASS -5  Vitals:   11-18-2015 1100 11/18/15 1122 11-18-2015 1200 Nov 18, 2015 1300  BP: 123/63  129/64 125/63  Pulse: 81  81 82  Resp: 18  18 18   Temp:      TempSrc:      SpO2: 100% 100% 100% 99%  Weight:      Height:       Fully unresponsive No cranial nerve reflexes present No withdrawal from pain No spontaneous movement B rhonchi Reg, + M Abd NT, no BS Diffuse severe edema in all extremities  BMP Latest Ref Rng & Units 11-18-15 11/04/2015 11/04/2015  Glucose 65 - 99 mg/dL 88 628(Z) 662(H)  BUN 6 - 20 mg/dL 47(M) 54(Y) 50(P)  Creatinine 0.61 - 1.24 mg/dL 5.46(F) 6.81(E) 7.51(Z)  Sodium 135 - 145 mmol/L 139 138 138  Potassium 3.5 - 5.1 mmol/L 4.1 3.8 3.7  Chloride 101 - 111 mmol/L 103 102 106  CO2 22 - 32 mmol/L 25 23 21(L)  Calcium 8.9 - 10.3 mg/dL 7.6(L) 7.6(L) 7.4(L)   CBC Latest Ref Rng & Units 11/18/15 11/04/2015 11/04/2015  WBC 3.8 - 10.6 K/uL 8.9 11.6(H) 13.7(H)  Hemoglobin 13.0 - 18.0 g/dL 7.1(L) 7.2(L) 7.6(L)  Hematocrit 40.0 - 52.0 % 20.0(L) 21.0(L) 21.9(L)  Platelets 150 - 440 K/uL 74(L) 70(L) 81(L)   CXR: improved aeration on L  CT head 07/31: Severe anoxic injury with progressed generalized cerebral edema and diffuse infarct  EEG 07/31:   Apnea test 07/31: No spontaneous respiratory effort in > 8 mins. PaCOS 36 > 58 torr  IMPRESSION: Asystolic cardiac arrest Acute respiratory failure due to cardiac arrest Pulmonary infiltrates Hypertension, resolved AKI Suspected CKD Hyperkalemia Metabolic acidosis Acute blood loss anemia Hemo-pneumoperitoneum - likely perforated ulcer Post anoxic encephalopathy Brain death  PLAN/REC: CDS notified Will meet with family to discuss discontinuation of vent  CCM time: 60 mins The above time includes time spent in consultation with patient and/or family members and reviewing care plan on multidisciplinary rounds  Billy Fischer, MD PCCM service Mobile [phone] Pager [phone]

## 2015-11-06 NOTE — Progress Notes (Signed)
An apnea test was performed on the pt. Per protocol an oxygen tubing was placed down the ETT with 8 L 02. The Cuff was deflated and the ventilator was disconnected. No respiratory effect or movement was seen for 8 minutes. The monitor confirmed that he was apneic for 8 minutes. The HR stayed about 80 and his systolic B/P stayed in the low 120s. An ABG was obtained during this time. Results may be found in the results review.

## 2015-11-06 NOTE — Progress Notes (Signed)
Pt remains unresponsive. CRRT running and Pt still rewarming per hypothermia protocol. Vitals stable. Latest labs called Dr. Clayborn Bigness. Hemoglobin now 7.2. CBC recheck already scheduled for 0500, will call eLink MD back then if hemoglobin <7. No new orders at this time. Will continue to monitor Pt closely.

## 2015-11-06 NOTE — Progress Notes (Signed)
Updated Dr. Sung Amabile that patient reached rewarming temperature around 6am this morning.  CT of head scheduled at 9am- requested for TPA to unclot dialysis line due to heparin not working that was instilled.  Hgb. 7.1 EEG scheduled for today and waiting on chest xray.  Then notified Dr. Wynelle Link that CRRT not resuming at this time due to patient's venous line clotted off.  Orders to instill TPA- patient to have repeat CT scan of head around 9am.

## 2015-11-06 DEATH — deceased

## 2015-11-08 LAB — CULTURE, BLOOD (ROUTINE X 2)
Culture: NO GROWTH
Culture: NO GROWTH

## 2015-11-25 ENCOUNTER — Other Ambulatory Visit: Payer: Self-pay | Admitting: Adult Health

## 2015-12-07 NOTE — Discharge Summary (Signed)
DEATH SUMMARY  DATE OF ADMISSION:  Nov 22, 2015  DATE OF DISCHARGE/DEATH:    ADMISSION DIAGNOSES:   Acute hypoxic respiratory failure secondary to cardiac arrest Right upper lobe infiltrate Asystole cardiac arrest Hypertensive emergency Acute renal failure-Possible acute ATN secondary to volume depletion superimposed on diabetic nephropathy Hyperkalemia Acute GI bleed Hemopneumoperitoneum Acute blood loss anemia Sepsis of unknown source Right upper lobe infiltrate suggestive of CAP T1DM Acute encephalopathy-uremic versus hypoxic versus hypovolemia   DISCHARGE DIAGNOSES:   S/P asystolic cardiac arrest Acute respiratory failure post cardiac arrest Pulmonary infiltrates Hypertension, resolved AKI Suspected CKD Hyperkalemia Metabolic acidosis Acute blood loss anemia Hemo-pneumoperitoneum - likely perforated ulcer Post anoxic encephalopathy Brain death    PRESENTATION:   Pt was admitted with the following HPI and the above admission diagnoses:  HPI: 25 yr old Amish male with PMHx of Type I diabetes and HTN presented to ED today in cardiac arrest.  The history was obtained by speaking with the family (father, mother and sister). The patient had been sick from a pneumonia with cough for about 44months.  Many people in their community had the same issue and the patient had fever and cough.  He has continued to have the cough but not coughing anything up.  He also has complained of abdominal pain for several weeks as well.  His complaints of pain were mainly in the upper abdomen and he did have pain along is right flank and into his right shoulder as well.  He had been taking white willow bark, which is similar to aspirin and acts as a salicylate and can cause a severe renal failure, about three times a day for at least the past 3 weeks.  Recently he also has been taking increasing amounts of milk thistle and vitamin E, both of which can decrease platlets and increase bleeding risks.  He  began to have dark tarry stool about 1 week ago, no frank blood as well as have some vomiting with streaks of blood. He also had increase swelling throughout body over the past few weeks.   He became increasingly more fatigued throughout the week until collapsing.  Father started CPR after calling 911. The patient was brought to the ED by EMS with glucose of 465 and in active CPR with ROSC x 2 and 6 rounds of epi per ED note. He then went into asystole with 2 doses of Epi and atropine with ROSC. He had a hemoglobin of 3 on arrival, pH 6.86. 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   HOSPITAL COURSE:   He was admitted to ICU under care of PCCM service and supported on mechanical ventilation, hypothermia protocol (target T of 36 degrees). A hemodialysis catheter was placed and CRRT was undertaken until the catheter clotted. He was seen in consultation by general surgery and it was decided that no surgery would be undertaken due to his overall very poor prognosis. On the day following admission, his exam was consistent with brain death. A repeat CT head revealed very severe and diffuse cerebral edema with scattered watershed infarcts. EEG revealed electrical silence. Apnea test was performed and supported the diagnosis of brain death. Family was updated on several occasions throughout the day. When all data supported the diagnosis of brain death, mechanical ventilation was discontinued and cardiac death ensued shortly thereafter    Cause of death:  Brain death due to prolonged cardiac arrest due to hemorrhagic shock  due to perforated ulcer with hemopneumoperitoneum  Contributing factors: Type I DM  Autopsy:  No    Billy Fischer, MD PCCM service Mobile [phone] Pager [phone] 11/07/2015

## 2017-10-31 IMAGING — CT CT ABD-PELV W/O CM
2 of 4 series · 16 of 46 positions shown, 18 images · non-contrast
Comparison: Two-view abdomen from the same day.

CLINICAL DATA: Acute blood loss and anemia secondary to prolonged
GI bleed. Asystole with prolonged hypoxia. Type 1 diabetes.

EXAM:
CT ABDOMEN AND PELVIS WITHOUT CONTRAST
TECHNIQUE: Multidetector CT imaging of the abdomen and pelvis was performed
following the standard protocol without IV contrast.

[Series 3: axial st · axial · 0.85mm/px · z∈[-760,-320]mm · 13 of 96 slices shown, 15 images]
[im 4/96  soft-tissue]
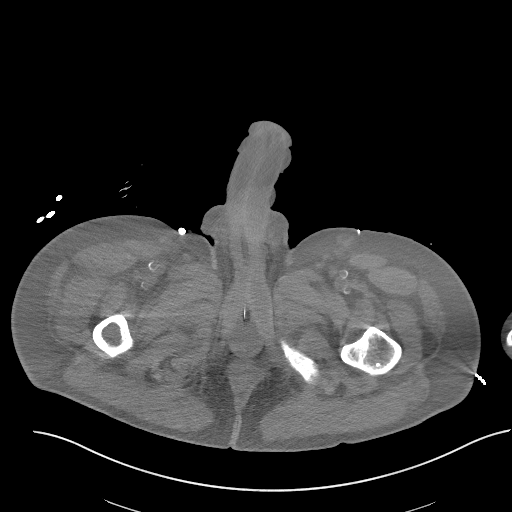
[im 4/96  bone]
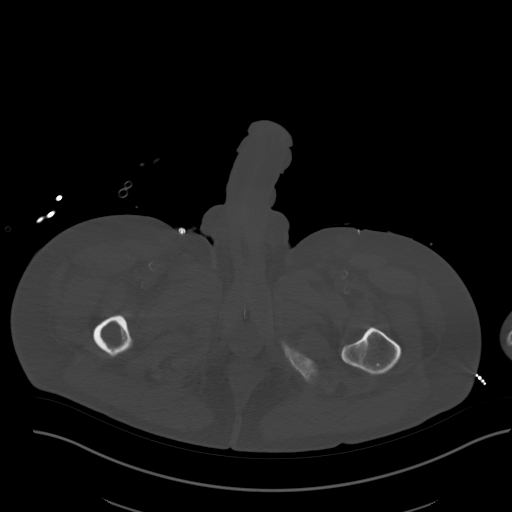
[im 11/96  soft-tissue]
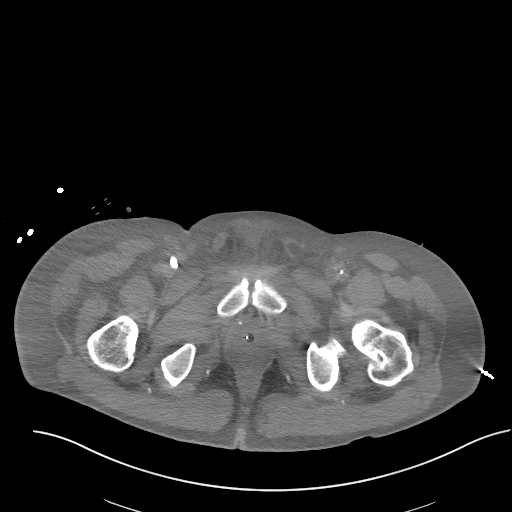
[im 19/96  soft-tissue]
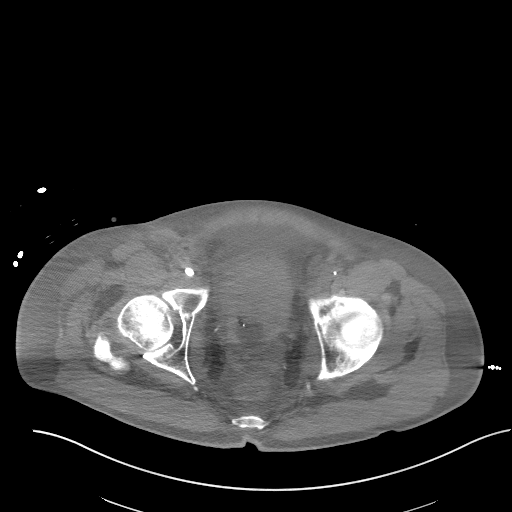
[im 26/96  soft-tissue]
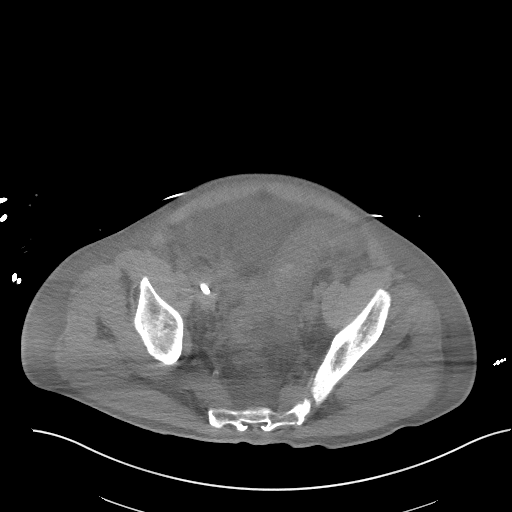
[im 33/96  soft-tissue]
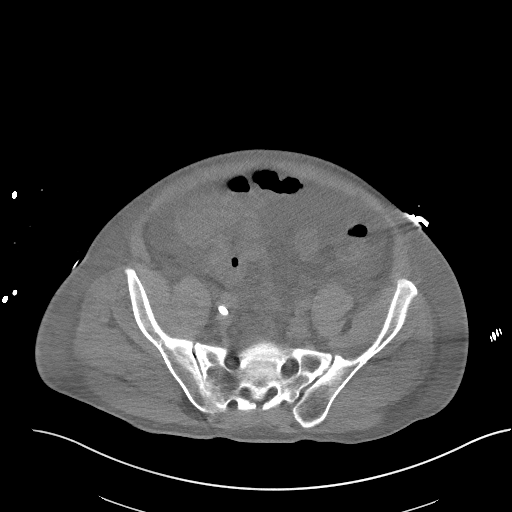
[im 41/96  soft-tissue]
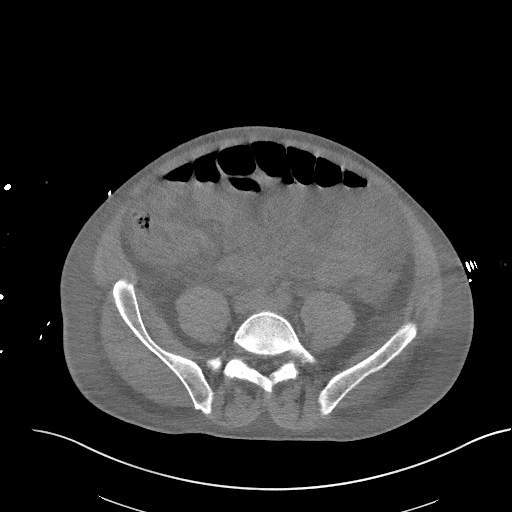
[im 48/96  soft-tissue]
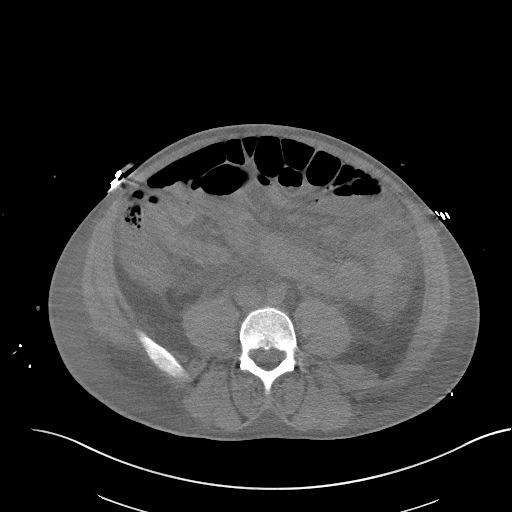
[im 55/96  soft-tissue]
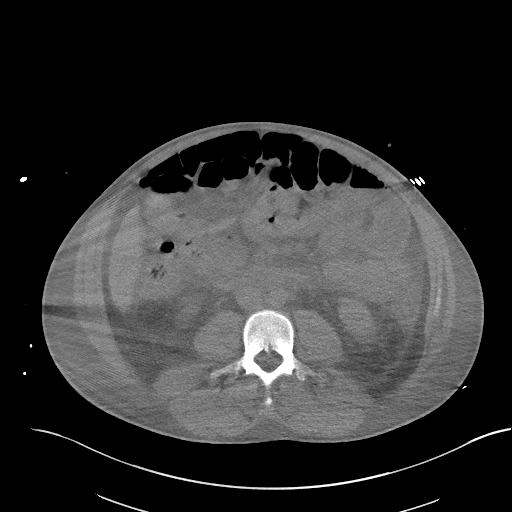
[im 63/96  soft-tissue]
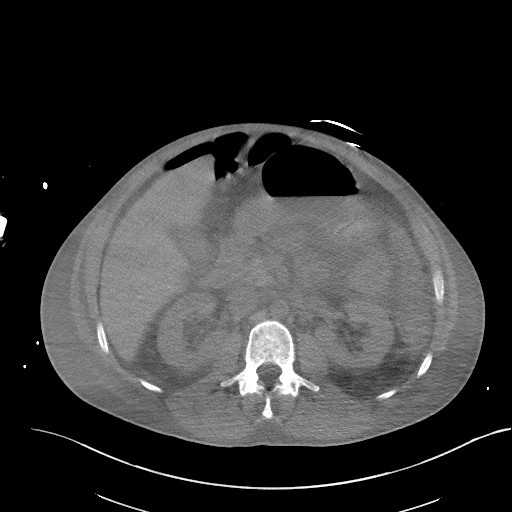
[im 63/96  bone]
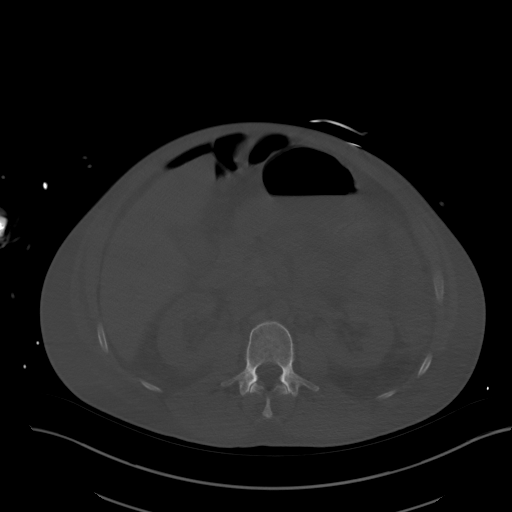
[im 70/96  soft-tissue]
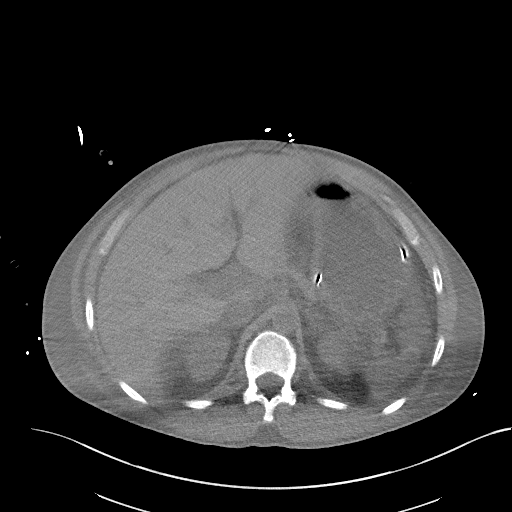
[im 77/96  soft-tissue]
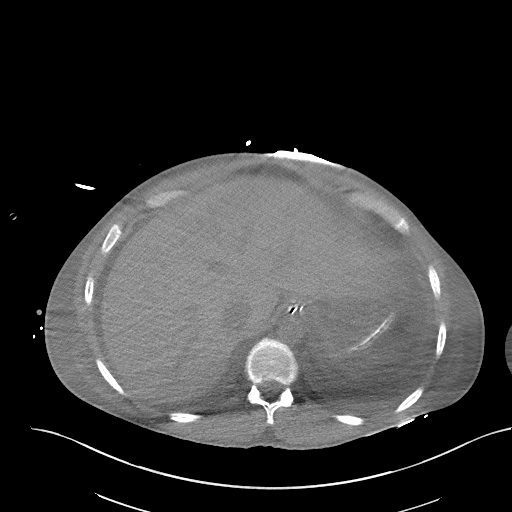
[im 85/96  soft-tissue]
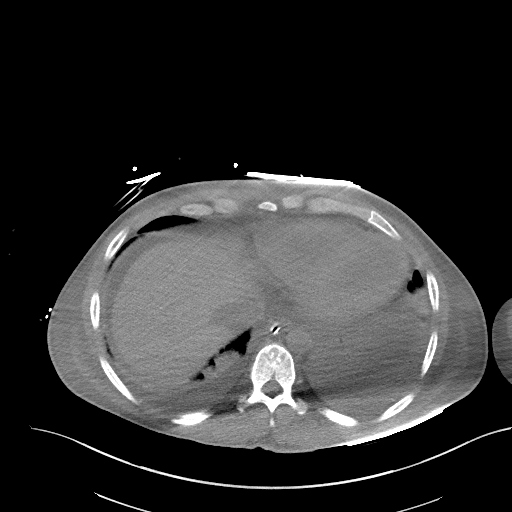
[im 92/96  soft-tissue]
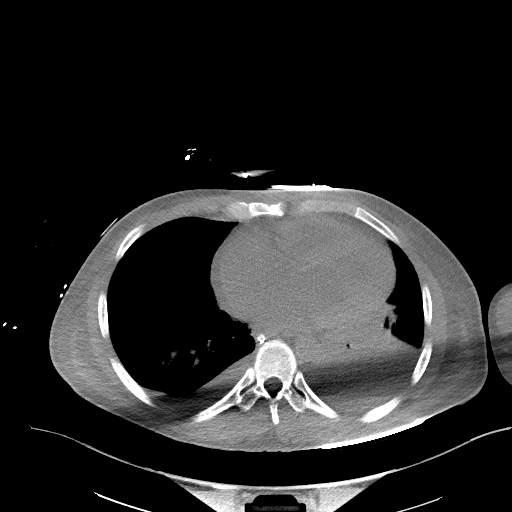

[Series 6: coronal st · coronal · 0.87mm/px · 3 of 106 slices shown]
[im 36/106  soft-tissue]
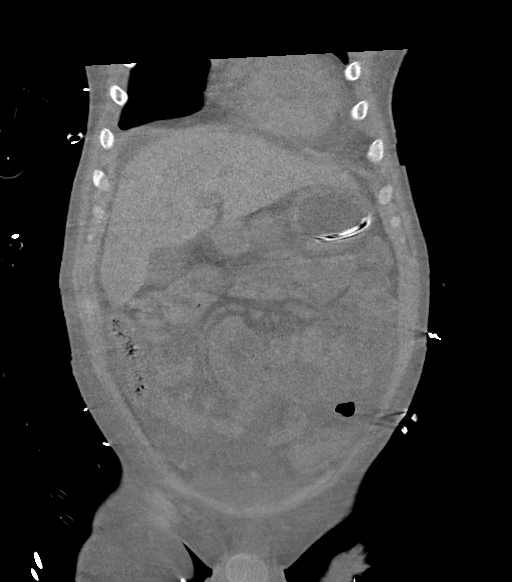
[im 47/106  soft-tissue]
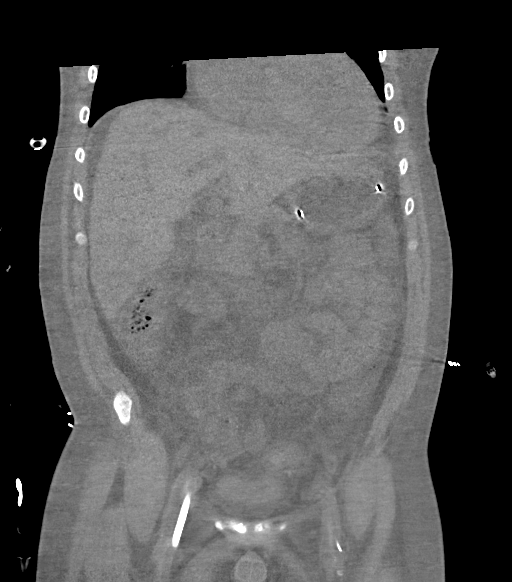
[im 59/106  soft-tissue]
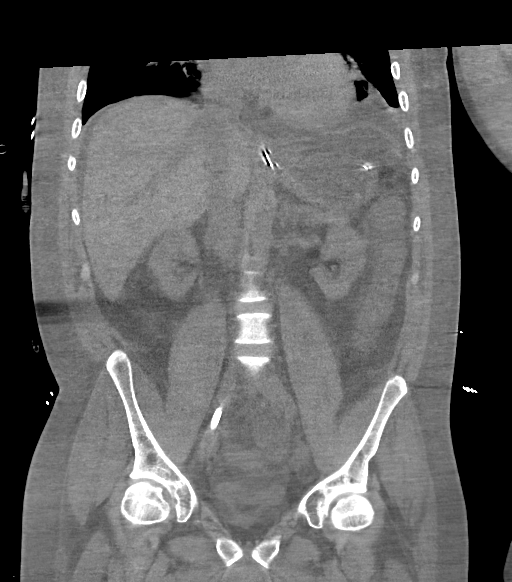

[16 of 46 positions shown; findings below may reference images not displayed]

FINDINGS: Lower chest: Left greater than right pleural effusions are present.
Left greater than right lower lobe airspace disease is present as
well. There is increase consolidation on the left. The heart is
mildly enlarged. Anemia is noted.

No significant pericardial effusion is present.

Hepatobiliary: No focal hepatic lesions are present. The common bile
duct is within normal limits. Inflammatory changes surround the
gallbladder.

Pancreas: Inflammatory changes present at the head and proximal body
of the pancreas no discrete mass or cyst is present.

Spleen: The spleen is small.  No focal lesions are evident.

Adrenals/Urinary Tract: Adrenal glands are within normal limits. The
kidneys and ureters are unremarkable. There is no obstruction or
stone. A Foley catheter is present within the urinary bladder.

Stomach/Bowel: NG tube is in place. The stomach and duodenum
demonstrate no focal lesions. There is diffuse mesenteric edema.
Fluid levels are present within nondilated loops of bowel. At least
1 locule of free air is present in the abdomen on image 37 of series
3. Other small loculated are suspected in the right upper quadrant
as well.

Vascular/Lymphatic: Bilateral femoral lines are in place. Marked
hypoattenuation in the aorta is compatible with severe anemia. No
focal vascular lesions are present. There is no significant
adenopathy. Multiple small nodes are present.

Reproductive: Within normal limits.

Other: Extensive high-density abdominal ascites are present. This is
compatible with blood products.

Musculoskeletal: Bone windows demonstrate no focal lytic or blastic
lesions.
IMPRESSION: 1. Diffuse hemoperitoneum.
2. Bronchials of free air compatible with bowel perforation hand
pneumoperitoneum.
3. Marked anemia.
4. Poor definition of pancreatic head and proximal pancreas may
reflect inflammatory change.
5. Large bilateral pleural effusions and associated airspace
disease, left greater than right.
These results were called by telephone at the time of interpretation
on 11/03/2015 at [DATE] to Dr. Liiceth, who verbally
acknowledged these results.
